# Patient Record
Sex: Female | Born: 1960
Health system: Southern US, Community
[De-identification: ages and names within clinical notes are randomized; demographics above are authoritative.]

## PROBLEM LIST (undated history)

## (undated) DIAGNOSIS — I10 Essential (primary) hypertension: Secondary | ICD-10-CM

---

## 1988-01-28 HISTORY — PX: THYROIDECTOMY: SHX17

## 2010-01-27 HISTORY — PX: ORIF TIBIA & FIBULA FRACTURES: SHX2131

## 2018-01-27 ENCOUNTER — Encounter (HOSPITAL_COMMUNITY): Payer: Self-pay | Admitting: Emergency Medicine

## 2018-01-27 ENCOUNTER — Emergency Department (HOSPITAL_COMMUNITY): Payer: Self-pay

## 2018-01-27 ENCOUNTER — Emergency Department (HOSPITAL_COMMUNITY)
Admission: EM | Admit: 2018-01-27 | Discharge: 2018-01-27 | Disposition: A | Discharge status: Home / Self Care | Payer: Self-pay | Attending: Emergency Medicine | Admitting: Emergency Medicine

## 2018-01-27 ENCOUNTER — Other Ambulatory Visit: Payer: Self-pay

## 2018-01-27 DIAGNOSIS — R69 Illness, unspecified: Secondary | ICD-10-CM

## 2018-01-27 DIAGNOSIS — I10 Essential (primary) hypertension: Secondary | ICD-10-CM | POA: Insufficient documentation

## 2018-01-27 DIAGNOSIS — J111 Influenza due to unidentified influenza virus with other respiratory manifestations: Secondary | ICD-10-CM | POA: Insufficient documentation

## 2018-01-27 HISTORY — DX: Essential (primary) hypertension: I10

## 2018-01-27 MED ORDER — BENZONATATE 100 MG PO CAPS: 100.0000 mg | ORAL_CAPSULE | Freq: Three times a day (TID) | ORAL | 0 refills | Status: DC

## 2018-01-27 MED ORDER — ALBUTEROL SULFATE HFA 108 (90 BASE) MCG/ACT IN AERS
2.0000 | INHALATION_SPRAY | Freq: Once | RESPIRATORY_TRACT | Status: AC
  Administered 2018-01-27: 2 via RESPIRATORY_TRACT
  Filled 2018-01-27: qty 6.7

## 2018-01-27 NOTE — ED Provider Notes (Signed)
[facility]-EMERGENCY DEPT Provider Note   CSN: [id number] Arrival date & time: 01/27/18  1035     History   Chief Complaint Chief Complaint  Patient presents with  . Flu Like Symptoms   Patient declines interpreter and states she speaks [place].  HPI Shannon Khan is a 58 y.o. female.  HPI  Patient is a 58 year old female with a history of hypertension who presents the emergency department with her daughter for evaluation of flulike symptoms that began about a week ago.  Patient has had cough, fever, body aches.  She also reports rhinorrhea, nasal congestion and a sore throat.  She has been taking Tylenol at home.  Symptoms have been constant since onset.  They have been improving.  Daughter at bedside states that patient recently moved here from [place].  States that she has had most of her immunizations but has not had her flu shot this year yet.  Patient denies any shortness of breath or chest pain.  No lower extremity swelling or abdominal pain.  No nausea vomiting diarrhea or urinary symptoms  Past Medical History:  Diagnosis Date  . Hypertension     There are no active problems to display for this patient.   History reviewed. No pertinent surgical history.   OB History   No obstetric history on file.      Home Medications    Prior to Admission medications   Medication Sig Start Date End Date Taking? Authorizing Provider  benzonatate (TESSALON) 100 MG capsule Take 1 capsule (100 mg total) by mouth every 8 (eight) hours. 01/27/18   Delesha Pohlman S, PA-C    Family History No family history on file.  Social History Social History   Tobacco Use  . Smoking status: Not on file  Substance Use Topics  . Alcohol use: Not on file  . Drug use: Not on file     Allergies   Patient has no known allergies.   Review of Systems Review of Systems  Constitutional: Positive for chills, diaphoresis and fever.  HENT: Positive for congestion,  rhinorrhea and sore throat. Negative for ear pain.   Eyes: Negative for pain and visual disturbance.  Respiratory: Positive for cough and wheezing. Negative for shortness of breath.   Cardiovascular: Negative for chest pain and leg swelling.  Gastrointestinal: Negative for abdominal pain, constipation, diarrhea, nausea and vomiting.  Genitourinary: Negative for dysuria.  Musculoskeletal: Positive for myalgias.  Skin: Negative for color change and rash.  Neurological: Negative for headaches.  All other systems reviewed and are negative.    Physical Exam Updated Vital Signs BP (!) 151/91   Pulse 78   Temp 98.2 F (36.8 C) (Oral)   Resp 16   Wt 98.7 kg   SpO2 98%   Physical Exam Vitals signs and nursing note reviewed.  Constitutional:      General: She is not in acute distress.    Appearance: She is well-developed. She is not ill-appearing or toxic-appearing.  HENT:     Head: Normocephalic and atraumatic.     Ears:     Comments: Right TM obstructed by cerumen.  Left TM appears opaque and likely with effusion present.  No erythema or bulging.    Nose:     Comments: Bilateral nasal turbinates are swollen.    Mouth/Throat:     Mouth: Mucous membranes are moist.     Pharynx: No oropharyngeal exudate or posterior oropharyngeal erythema.  Eyes:     Conjunctiva/sclera: Conjunctivae normal.  Neck:     Musculoskeletal: Neck supple.  Cardiovascular:     Rate and Rhythm: Normal rate and regular rhythm.     Heart sounds: Normal heart sounds. No murmur.  Pulmonary:     Effort: Pulmonary effort is normal. No respiratory distress.     Breath sounds: Normal breath sounds. No stridor. No wheezing or rhonchi.  Abdominal:     Palpations: Abdomen is soft.     Tenderness: There is no abdominal tenderness.  Lymphadenopathy:     Cervical: No cervical adenopathy.  Skin:    General: Skin is warm and dry.     Capillary Refill: Capillary refill takes less than 2 seconds.  Neurological:      Mental Status: She is alert.  Psychiatric:        Mood and Affect: Mood normal.      ED Treatments / Results  Labs (all labs ordered are listed, but only abnormal results are displayed) Labs Reviewed - No data to display  EKG None  Radiology Dg Chest 2 View  Result Date: 01/27/2018 CLINICAL DATA:  Cough fever body ache EXAM: CHEST - 2 VIEW COMPARISON:  None. FINDINGS: The heart size and mediastinal contours are within normal limits. Both lungs are clear. Degenerative changes of the spine. IMPRESSION: No active cardiopulmonary disease. Electronically Signed   By: Jasmine Pang M.D.   On: 01/27/2018 15:05    Procedures Procedures (including critical care time)  Medications Ordered in ED Medications  albuterol (PROVENTIL HFA;VENTOLIN HFA) 108 (90 Base) MCG/ACT inhaler 2 puff (2 puffs Inhalation Given 01/27/18 1503)     Initial Impression / Assessment and Plan / ED Course  I have reviewed the triage vital signs and the nursing notes.  Pertinent labs & imaging results that were available during my care of the patient were reviewed by me and considered in my medical decision making (see chart for details).     Final Clinical Impressions(s) / ED Diagnoses   Final diagnoses:  Influenza-like illness   Pt CXR negative for acute infiltrate. sxs most likely secondary to flu like illness. Discussed that antibiotics are not indicated for viral infections and that patient is out of the window for tamiflu. Pt will be discharged with symptomatic treatment.  Verbalizes understanding and is agreeable with plan. Pt is hemodynamically stable & in NAD prior to dc. Will given info for her to f/u with pcp. Advised to return if worse. She verbalizes understanding and is in agreement with plan. All questions answered.   ED Discharge Orders         Ordered    benzonatate (TESSALON) 100 MG capsule  Every 8 hours     01/27/18 1517           Merle Cirelli S, PA-C 01/27/18 1517    Linwood Dibbles, MD 01/30/18 1620

## 2018-01-27 NOTE — ED Triage Notes (Signed)
Pt complaint of cough, fever, aches for a few days; treating with tylenol at home.

## 2018-01-27 NOTE — Discharge Instructions (Addendum)

## 2018-03-30 ENCOUNTER — Ambulatory Visit: Payer: Self-pay | Admitting: Family Medicine

## 2018-06-14 ENCOUNTER — Other Ambulatory Visit: Payer: Self-pay

## 2018-06-14 ENCOUNTER — Ambulatory Visit: Payer: Self-pay | Attending: Internal Medicine | Admitting: Internal Medicine

## 2018-06-14 ENCOUNTER — Encounter: Payer: Self-pay | Admitting: Internal Medicine

## 2018-06-14 VITALS — BP 135/90 | HR 100 | Temp 98.7°F | Resp 16 | Ht 64.0 in | Wt 226.4 lb

## 2018-06-14 DIAGNOSIS — M25562 Pain in left knee: Secondary | ICD-10-CM

## 2018-06-14 DIAGNOSIS — Z1239 Encounter for other screening for malignant neoplasm of breast: Secondary | ICD-10-CM

## 2018-06-14 DIAGNOSIS — M25561 Pain in right knee: Secondary | ICD-10-CM

## 2018-06-14 DIAGNOSIS — Z9889 Other specified postprocedural states: Secondary | ICD-10-CM

## 2018-06-14 DIAGNOSIS — I10 Essential (primary) hypertension: Secondary | ICD-10-CM

## 2018-06-14 DIAGNOSIS — Z1211 Encounter for screening for malignant neoplasm of colon: Secondary | ICD-10-CM

## 2018-06-14 DIAGNOSIS — G8929 Other chronic pain: Secondary | ICD-10-CM

## 2018-06-14 DIAGNOSIS — E669 Obesity, unspecified: Secondary | ICD-10-CM

## 2018-06-14 DIAGNOSIS — Z9009 Acquired absence of other part of head and neck: Secondary | ICD-10-CM

## 2018-06-14 DIAGNOSIS — E890 Postprocedural hypothyroidism: Secondary | ICD-10-CM

## 2018-06-14 MED ORDER — MELOXICAM 15 MG PO TABS: 15.0000 mg | ORAL_TABLET | Freq: Every day | ORAL | 1 refills | Status: DC

## 2018-06-14 MED ORDER — HYDROCHLOROTHIAZIDE 12.5 MG PO TABS: 12.5000 mg | ORAL_TABLET | Freq: Every day | ORAL | 5 refills | Status: DC

## 2018-06-14 MED ORDER — AMLODIPINE BESYLATE 10 MG PO TABS: 10.0000 mg | ORAL_TABLET | Freq: Every day | ORAL | 5 refills | Status: DC

## 2018-06-14 NOTE — Progress Notes (Signed)
Pt is taking Bendroflumethiazide 2.5mg  and aspirin 75mg 

## 2018-06-14 NOTE — Patient Instructions (Signed)
Try cutting back on salt in the foods.   Continue Norvasc.  Start Hydrochlorothiazide.  Work on getting your weight down through healthy eating habits and regular exercise. Follow a Healthy Eating Plan - You can do it! Limit sugary drinks.  Avoid sodas, sweet tea, sport or energy drinks, or fruit drinks.  Drink water, lo-fat milk, or diet drinks. Limit snack foods.   Cut back on candy, cake, cookies, chips, ice cream.  These are a special treat, only in small amounts. Eat plenty of vegetables.  Especially dark green, red, and orange vegetables. Aim for at least 3 servings a day. More is better! Include fruit in your daily diet.  Whole fruit is much healthier than fruit juice! Limit "white" bread, "white" pasta, "white" rice.   Choose "100% whole grain" products, brown or wild rice. Avoid fatty meats. Try "Meatless Monday" and choose eggs or beans one day a week.  When eating meat, choose lean meats like chicken, [place], and fish.  Grill, broil, or bake meats instead of frying, and eat poultry without the skin. Eat less salt.  Avoid frozen pizzas, frozen dinners and salty foods.  Use seasonings other than salt in cooking.  This can help blood pressure and keep you from swelling Beer, wine and liquor have calories.  If you can safely drink alcohol, limit to 1 drink per day for women, 2 drinks for men  Start Meloxicam 15 mg daily for pain in the knees.  You can also take Tylenol 500 mg twice a day with this.
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
## 2018-06-15 LAB — COMPREHENSIVE METABOLIC PANEL
ALT: 30 IU/L (ref 0–32)
AST: 31 IU/L (ref 0–40)
Albumin/Globulin Ratio: 1.4 (ref 1.2–2.2)
Albumin: 4.6 g/dL (ref 3.8–4.9)
Alkaline Phosphatase: 106 IU/L (ref 39–117)
BUN/Creatinine Ratio: 14 (ref 9–23)
BUN: 10 mg/dL (ref 6–24)
Bilirubin Total: 0.2 mg/dL (ref 0.0–1.2)
CO2: 28 mmol/L (ref 20–29)
Calcium: 10.4 mg/dL — ABNORMAL HIGH (ref 8.7–10.2)
Chloride: 101 mmol/L (ref 96–106)
Creatinine, Ser: 0.72 mg/dL (ref 0.57–1.00)
GFR calc Af Amer: 106 mL/min/{1.73_m2} (ref >59)
GFR calc non Af Amer: 92 mL/min/{1.73_m2} (ref >59)
Globulin, Total: 3.4 g/dL (ref 1.5–4.5)
Glucose: 116 mg/dL — ABNORMAL HIGH (ref 65–99)
Potassium: 4.3 mmol/L (ref 3.5–5.2)
Sodium: 145 mmol/L — ABNORMAL HIGH (ref 134–144)
Total Protein: 8 g/dL (ref 6.0–8.5)

## 2018-06-15 LAB — CBC
Hematocrit: 46.5 % (ref 34.0–46.6)
Hemoglobin: 15.8 g/dL (ref 11.1–15.9)
MCH: 28.6 pg (ref 26.6–33.0)
MCHC: 34 g/dL (ref 31.5–35.7)
MCV: 84 fL (ref 79–97)
Platelets: 249 10*3/uL (ref 150–450)
RBC: 5.52 x10E6/uL — ABNORMAL HIGH (ref 3.77–5.28)
RDW: 12.9 % (ref 11.7–15.4)
WBC: 8.5 10*3/uL (ref 3.4–10.8)

## 2018-06-15 LAB — TSH+T4F+T3FREE
Free T4: 1.18 ng/dL (ref 0.82–1.77)
T3, Free: 3.1 pg/mL (ref 2.0–4.4)
TSH: 2.26 u[IU]/mL (ref 0.450–4.500)

## 2018-06-15 LAB — LIPID PANEL
Chol/HDL Ratio: 4 ratio (ref 0.0–4.4)
Cholesterol, Total: 258 mg/dL — ABNORMAL HIGH (ref 100–199)
HDL: 64 mg/dL (ref >39)
LDL Calculated: 176 mg/dL — ABNORMAL HIGH (ref 0–99)
Triglycerides: 92 mg/dL (ref 0–149)
VLDL Cholesterol Cal: 18 mg/dL (ref 5–40)

## 2018-06-23 ENCOUNTER — Telehealth: Payer: Self-pay

## 2018-06-23 LAB — FECAL OCCULT BLOOD, IMMUNOCHEMICAL: Fecal Occult Bld: NEGATIVE

## 2018-06-23 NOTE — Telephone Encounter (Signed)
Contacted pt to go over lab results pt didn't answer lvm asking pt to give me a call at her earliest convenience  

## 2018-06-23 NOTE — Telephone Encounter (Signed)
-----   Message from Marcine Matar, MD sent at 06/16/2018  1:22 PM EDT ----- Let pt and daughter know that her blood count is good meaning no anemia.  Blood sugar was mildly elevated.  Will screen for DM on f/u visit.  Sodium amd Calcium level mildly elevated.  The increase calcium can be caused by the thiazide diuretic blood pressure medicine she is on.  Try to drink several glasses of water daily.  LFT nl.  LDL cholesterol 176 with goal being less than 100.  Healthy eating habits and regular exercise as discussed yesterday can help to lower cholesterol.  Thyroid level is nl.

## 2018-08-10 ENCOUNTER — Other Ambulatory Visit: Payer: Self-pay | Admitting: Internal Medicine

## 2018-08-16 ENCOUNTER — Other Ambulatory Visit: Payer: Self-pay | Admitting: Internal Medicine

## 2018-09-16 ENCOUNTER — Encounter (HOSPITAL_COMMUNITY): Payer: Self-pay

## 2018-09-16 ENCOUNTER — Other Ambulatory Visit: Payer: Self-pay

## 2018-09-16 ENCOUNTER — Observation Stay (HOSPITAL_COMMUNITY)
Admission: EM | Admit: 2018-09-16 | Discharge: 2018-09-17 | Disposition: A | Discharge status: Home / Self Care | Payer: Self-pay | Attending: Internal Medicine | Admitting: Internal Medicine

## 2018-09-16 ENCOUNTER — Emergency Department (HOSPITAL_COMMUNITY): Payer: Self-pay

## 2018-09-16 DIAGNOSIS — Z79899 Other long term (current) drug therapy: Secondary | ICD-10-CM | POA: Insufficient documentation

## 2018-09-16 DIAGNOSIS — Z9009 Acquired absence of other part of head and neck: Secondary | ICD-10-CM

## 2018-09-16 DIAGNOSIS — R52 Pain, unspecified: Secondary | ICD-10-CM

## 2018-09-16 DIAGNOSIS — E1165 Type 2 diabetes mellitus with hyperglycemia: Secondary | ICD-10-CM | POA: Insufficient documentation

## 2018-09-16 DIAGNOSIS — Z7982 Long term (current) use of aspirin: Secondary | ICD-10-CM | POA: Insufficient documentation

## 2018-09-16 DIAGNOSIS — E876 Hypokalemia: Secondary | ICD-10-CM | POA: Insufficient documentation

## 2018-09-16 DIAGNOSIS — E890 Postprocedural hypothyroidism: Secondary | ICD-10-CM

## 2018-09-16 DIAGNOSIS — I4891 Unspecified atrial fibrillation: Principal | ICD-10-CM | POA: Insufficient documentation

## 2018-09-16 DIAGNOSIS — R0789 Other chest pain: Secondary | ICD-10-CM | POA: Insufficient documentation

## 2018-09-16 DIAGNOSIS — I10 Essential (primary) hypertension: Secondary | ICD-10-CM | POA: Insufficient documentation

## 2018-09-16 DIAGNOSIS — Z6841 Body Mass Index (BMI) 40.0 and over, adult: Secondary | ICD-10-CM | POA: Insufficient documentation

## 2018-09-16 DIAGNOSIS — Z20828 Contact with and (suspected) exposure to other viral communicable diseases: Secondary | ICD-10-CM | POA: Insufficient documentation

## 2018-09-16 DIAGNOSIS — E669 Obesity, unspecified: Secondary | ICD-10-CM | POA: Diagnosis present

## 2018-09-16 LAB — CBC
HCT: 43.2 % (ref 36.0–46.0)
Hemoglobin: 14.5 g/dL (ref 12.0–15.0)
MCH: 29.2 pg (ref 26.0–34.0)
MCHC: 33.6 g/dL (ref 30.0–36.0)
MCV: 86.9 fL (ref 80.0–100.0)
Platelets: 284 10*3/uL (ref 150–400)
RBC: 4.97 MIL/uL (ref 3.87–5.11)
RDW: 13.4 % (ref 11.5–15.5)
WBC: 9.8 10*3/uL (ref 4.0–10.5)
nRBC: 0 % (ref 0.0–0.2)

## 2018-09-16 LAB — BASIC METABOLIC PANEL
Anion gap: 11 (ref 5–15)
BUN: 13 mg/dL (ref 6–20)
CO2: 29 mmol/L (ref 22–32)
Calcium: 9.5 mg/dL (ref 8.9–10.3)
Chloride: 101 mmol/L (ref 98–111)
Creatinine, Ser: 1.05 mg/dL — ABNORMAL HIGH (ref 0.44–1.00)
GFR calc Af Amer: >60 mL/min (ref >60)
GFR calc non Af Amer: 58 mL/min — ABNORMAL LOW (ref >60)
Glucose, Bld: 162 mg/dL — ABNORMAL HIGH (ref 70–99)
Potassium: 3.5 mmol/L (ref 3.5–5.1)
Sodium: 141 mmol/L (ref 135–145)

## 2018-09-16 LAB — I-STAT BETA HCG BLOOD, ED (MC, WL, AP ONLY): I-stat hCG, quantitative: <5 m[IU]/mL (ref <5)

## 2018-09-16 LAB — TROPONIN I (HIGH SENSITIVITY): Troponin I (High Sensitivity): 7 ng/L (ref <18)

## 2018-09-16 MED ORDER — HEPARIN (PORCINE) 25000 UT/250ML-% IV SOLN
1000.0000 [IU]/h | INTRAVENOUS | Status: DC
  Administered 2018-09-16: 1000 [IU]/h via INTRAVENOUS
  Filled 2018-09-16: qty 250

## 2018-09-16 MED ORDER — DILTIAZEM HCL-DEXTROSE 100-5 MG/100ML-% IV SOLN (PREMIX)
5.0000 mg/h | INTRAVENOUS | Status: DC
  Administered 2018-09-16 – 2018-09-17 (×2): 10 mg/h via INTRAVENOUS
  Filled 2018-09-16 (×3): qty 100

## 2018-09-16 MED ORDER — DILTIAZEM LOAD VIA INFUSION
10.0000 mg | Freq: Once | INTRAVENOUS | Status: AC
  Administered 2018-09-16: 10 mg via INTRAVENOUS
  Filled 2018-09-16: qty 10

## 2018-09-16 MED ORDER — HEPARIN BOLUS VIA INFUSION
4000.0000 [IU] | Freq: Once | INTRAVENOUS | Status: AC
  Administered 2018-09-16: 4000 [IU] via INTRAVENOUS
  Filled 2018-09-16: qty 4000

## 2018-09-16 MED ORDER — SODIUM CHLORIDE 0.9% FLUSH: 3.0000 mL | Freq: Once | INTRAVENOUS | Status: DC

## 2018-09-16 NOTE — Progress Notes (Signed)
ANTICOAGULATION CONSULT NOTE - Initial Consult  Pharmacy Consult for heparin Indication: atrial fibrillation  No Known Allergies  Patient Measurements: Height: 5' (152.4 cm) Weight: 231 lb 7.7 oz (105 kg) IBW/kg (Calculated) : 45.5 Heparin Dosing Weight: 71.3  Vital Signs: BP: 123/104 (08/20 2300) Pulse Rate: 140 (08/20 2300)  Labs: Recent Labs    09/16/18 2247  HGB 14.5  HCT 43.2  PLT 284    CrCl cannot be calculated (Patient's most recent lab result is older than the maximum 21 days allowed.).   Medical History: Past Medical History:  Diagnosis Date  . Hypertension     Medications:  See medication history  Assessment: 58 yo lady with new onset afib to start heparin.  Baseline Hg 14.5, PTLC 284 Goal of Therapy:  Heparin level 0.3-0.7 units/ml Monitor platelets by anticoagulation protocol: Yes   Plan:  Heparin 4000 unit bolus and drip at 1000 units/hr Check heparin level 6-8 hours after start Daily HL and CBC while on heparin Monitor for bleeding complications  Thanks for allowing pharmacy to be a part of this patient's care.  Excell Seltzer, PharmD Clinical Pharmacist 09/16/2018,11:31 PM

## 2018-09-16 NOTE — ED Provider Notes (Signed)
[facility] Provider Note   CSN: [id number] Arrival date & time: 09/16/18  2232     History   Chief Complaint Chief Complaint  Patient presents with   Shortness of Breath   Dizziness    HPI Shannon Khan is a 58 y.o. female.     Patient with limited language barrier - speaks limited english with native [place] - history of HTN presents with SOB, chest tightness, LE paresthesia described as 'pins and needles', and dizziness. No syncope or near syncope. No headache. Her daughter is at bedside and reports she checks on her daily. She has found her to be tachycardic with irregular rhythm to as high as 150 for the past couple of days without other symptoms. She is unsure when the elevated heart rate started. Today, her daughter states she was not feeling well, complaining of the SOB and chest discomfort. No fever or cough.   The history is provided by the patient. A language interpreter was used (Daughter is at bedside, available as Nurse, learning disabilitytranslator. ).  Shortness of Breath Associated symptoms: chest pain   Associated symptoms: no abdominal pain, no cough, no fever and no vomiting   Dizziness Associated symptoms: chest pain and shortness of breath   Associated symptoms: no vomiting     Past Medical History:  Diagnosis Date   Hypertension     Patient Active Problem List   Diagnosis Date Noted   Essential hypertension 06/14/2018   Obesity (BMI 30-39.9) 06/14/2018   History of thyroidectomy 06/14/2018   Chronic pain of both knees 06/14/2018    Past Surgical History:  Procedure Laterality Date   ORIF TIBIA & FIBULA FRACTURES Left 2012   THYROIDECTOMY  1990   for goiter     OB History   No obstetric history on file.      Home Medications    Prior to Admission medications   Medication Sig Start Date End Date Taking? Authorizing Provider  amLODipine (NORVASC) 10 MG tablet Take 1 tablet (10 mg total) by mouth daily. 06/14/18    Marcine MatarJohnson, Deborah B, MD  aspirin EC 81 MG tablet Take 81 mg by mouth daily.    [provider]  hydrochlorothiazide (HYDRODIURIL) 12.5 MG tablet Take 1 tablet (12.5 mg total) by mouth daily. 06/14/18   Marcine MatarJohnson, Deborah B, MD  meloxicam (MOBIC) 15 MG tablet Take 1 tablet (15 mg total) by mouth daily. MUST MAKE APPT FOR FURTHER REFILLS 08/10/18   Marcine MatarJohnson, Deborah B, MD    Family History Family History  Problem Relation Age of Onset   Hypertension Mother    Hypertension Father    Stroke Father    Diabetes Paternal Aunt     Social History Social History   Tobacco Use   Smoking status: Never Smoker  Substance Use Topics   Alcohol use: Not on file    Comment: occasional wine   Drug use: Never     Allergies   Patient has no known allergies.   Review of Systems Review of Systems  Constitutional: Negative for chills and fever.  HENT: Negative.   Respiratory: Positive for shortness of breath. Negative for cough.   Cardiovascular: Positive for chest pain.  Gastrointestinal: Negative.  Negative for abdominal pain and vomiting.  Musculoskeletal: Negative.   Skin: Negative.   Neurological: Positive for dizziness. Negative for syncope.       See HPI.     Physical Exam Updated Vital Signs BP (!) 123/104    Pulse Marland Kitchen(!)  140    Resp (!) 26    Ht 5' (1.524 m)    Wt 105 kg    SpO2 99%    BMI 45.21 kg/m   Physical Exam Vitals signs and nursing note reviewed.  Constitutional:      Appearance: She is well-developed.  HENT:     Head: Normocephalic.  Neck:     Musculoskeletal: Normal range of motion and neck supple.  Cardiovascular:     Rate and Rhythm: Tachycardia present. Rhythm irregular.     Heart sounds: No murmur.  Pulmonary:     Effort: Pulmonary effort is normal.     Breath sounds: Normal breath sounds. No wheezing, rhonchi or rales.  Chest:     Chest wall: No tenderness.  Abdominal:     General: Bowel sounds are normal.     Palpations: Abdomen is soft.      Tenderness: There is no abdominal tenderness. There is no guarding or rebound.  Musculoskeletal: Normal range of motion.     Right lower leg: No edema.     Left lower leg: No edema.  Skin:    General: Skin is warm and dry.  Neurological:     Mental Status: She is alert and oriented to person, place, and time.      ED Treatments / Results  Labs (all labs ordered are listed, but only abnormal results are displayed) Labs Reviewed  CBC  BASIC METABOLIC PANEL  I-STAT BETA HCG BLOOD, ED (MC, [facility], AP ONLY)  TROPONIN I (HIGH SENSITIVITY)    EKG EKG Interpretation  Date/Time:  Thursday September 16 2018 22:45:28 EDT Ventricular Rate:  152 PR Interval:    QRS Duration: 68 QT Interval:  256 QTC Calculation: 407 R Axis:   2 Text Interpretation:  Atrial fibrillation with rapid ventricular response Cannot rule out Anterior infarct , age undetermined T wave abnormality, consider inferior ischemia No previous tracing Confirmed by Blanchie Dessert [phone]) on 09/16/2018 10:53:12 PM   Radiology No results found.  Procedures Procedures (including critical care time) CRITICAL CARE Performed by: Dewaine Oats   Total critical care time: 40 minutes  Critical care time was exclusive of separately billable procedures and treating other patients.  Critical care was necessary to treat or prevent imminent or life-threatening deterioration.  Critical care was time spent personally by me on the following activities: development of treatment plan with patient and/or surrogate as well as nursing, discussions with consultants, evaluation of patient's response to treatment, examination of patient, obtaining history from patient or surrogate, ordering and performing treatments and interventions, ordering and review of laboratory studies, ordering and review of radiographic studies, pulse oximetry and re-evaluation of patient's condition.  Medications Ordered in ED Medications  sodium chloride  flush (NS) 0.9 % injection 3 mL (has no administration in time range)  diltiazem (CARDIZEM) 1 mg/mL load via infusion 10 mg (has no administration in time range)    And  diltiazem (CARDIZEM) 100 mg in dextrose 5% 171mL (1 mg/mL) infusion (has no administration in time range)     Initial Impression / Assessment and Plan / ED Course  I have reviewed the triage vital signs and the nursing notes.  Pertinent labs & imaging results that were available during my care of the patient were reviewed by me and considered in my medical decision making (see chart for details).        Patient to ED with new onset atrial fibrillation of unknown onset. Daughter is a cardiac nurse  who found her to be in A-fib with RVR yesterday, possibly the day before, with symptoms today of chest discomfort, SOB, paresthesias.   She is in A-fib RVR to a rate of 152. Stable blood pressure, afebrile. Cardizem bolus of 10 mg ordered followed by drip to control rate. Heparin started per pharmacy protocol.   CXR is abnormal, ?edema vs atypical infection. COVID pending. Patient denies cough or fever at home. Has been socially isolating.   D[facility] admission [facility] who accepts the patient onto his service.   Final Clinical Impressions(s) / ED Diagnoses   Final diagnoses:  Pain   1. New onset atrial fibrillation with RVR 2. Abnormal CXR ED Discharge Orders    None       Elpidio AnisUpstill, Dravin Lance, Cordelia Poche-C 09/17/18 0055    Zadie RhineWickline, Donald, MD 09/17/18 [phone]

## 2018-09-16 NOTE — ED Provider Notes (Signed)
Patient seen/examined in the Emergency Department in conjunction with Advanced Practice Provider [place] Patient reports dizziness, chest pain and SOB This has been present for several days but unclear when it started Exam : awake/alert, no distress. Irregular/tachycardic rhythm noted Plan: plan to admit for new onset  atrial fibrillation  This patients CHA2DS2-VASc Score and unadjusted Ischemic Stroke Rate (% per year) is equal to 2.2 % stroke rate/year from a score of 2  Above score calculated as 1 point each if present [CHF, HTN, DM, Vascular=MI/PAD/Aortic Plaque, Age if 65-74, or Female] Above score calculated as 2 points each if present [Age > 75, or Stroke/TIA/TE]      Ripley Fraise, MD 09/16/18 2323

## 2018-09-16 NOTE — ED Triage Notes (Signed)
Pt arrives POV for eval of dizziness and tachycardia. Daughter reports persistent dizziness yesterday and when she checked her HR yesterday noted that she was irregular w/ a rate of 150-160. Daughter reports ongoing tachycardia today w/ associated chest pain, worsening dizziness and elevated HR. No hx of afib or irregular heartrate.

## 2018-09-17 ENCOUNTER — Other Ambulatory Visit: Payer: Self-pay

## 2018-09-17 ENCOUNTER — Observation Stay (HOSPITAL_BASED_OUTPATIENT_CLINIC_OR_DEPARTMENT_OTHER): Payer: Self-pay

## 2018-09-17 ENCOUNTER — Encounter (HOSPITAL_COMMUNITY): Payer: Self-pay | Admitting: Internal Medicine

## 2018-09-17 DIAGNOSIS — I10 Essential (primary) hypertension: Secondary | ICD-10-CM

## 2018-09-17 DIAGNOSIS — R079 Chest pain, unspecified: Secondary | ICD-10-CM

## 2018-09-17 DIAGNOSIS — I480 Paroxysmal atrial fibrillation: Secondary | ICD-10-CM

## 2018-09-17 DIAGNOSIS — I4891 Unspecified atrial fibrillation: Secondary | ICD-10-CM

## 2018-09-17 DIAGNOSIS — E669 Obesity, unspecified: Secondary | ICD-10-CM

## 2018-09-17 DIAGNOSIS — I340 Nonrheumatic mitral (valve) insufficiency: Secondary | ICD-10-CM

## 2018-09-17 LAB — MAGNESIUM: Magnesium: 2 mg/dL (ref 1.7–2.4)

## 2018-09-17 LAB — CBC WITH DIFFERENTIAL/PLATELET
Abs Immature Granulocytes: 0.02 10*3/uL (ref 0.00–0.07)
Basophils Absolute: 0.1 10*3/uL (ref 0.0–0.1)
Basophils Relative: 1 %
Eosinophils Absolute: 0.1 10*3/uL (ref 0.0–0.5)
Eosinophils Relative: 1 %
HCT: 40.5 % (ref 36.0–46.0)
Hemoglobin: 13.7 g/dL (ref 12.0–15.0)
Immature Granulocytes: 0 %
Lymphocytes Relative: 49 %
Lymphs Abs: 4.5 10*3/uL — ABNORMAL HIGH (ref 0.7–4.0)
MCH: 28.9 pg (ref 26.0–34.0)
MCHC: 33.8 g/dL (ref 30.0–36.0)
MCV: 85.4 fL (ref 80.0–100.0)
Monocytes Absolute: 0.5 10*3/uL (ref 0.1–1.0)
Monocytes Relative: 5 %
Neutro Abs: 4.2 10*3/uL (ref 1.7–7.7)
Neutrophils Relative %: 44 %
Platelets: 257 10*3/uL (ref 150–400)
RBC: 4.74 MIL/uL (ref 3.87–5.11)
RDW: 13.2 % (ref 11.5–15.5)
WBC: 9.8 10*3/uL (ref 4.0–10.5)
nRBC: 0 % (ref 0.0–0.2)

## 2018-09-17 LAB — COMPREHENSIVE METABOLIC PANEL
ALT: 26 U/L (ref 0–44)
AST: 26 U/L (ref 15–41)
Albumin: 3.4 g/dL — ABNORMAL LOW (ref 3.5–5.0)
Alkaline Phosphatase: 76 U/L (ref 38–126)
Anion gap: 12 (ref 5–15)
BUN: 13 mg/dL (ref 6–20)
CO2: 24 mmol/L (ref 22–32)
Calcium: 9.1 mg/dL (ref 8.9–10.3)
Chloride: 104 mmol/L (ref 98–111)
Creatinine, Ser: 0.96 mg/dL (ref 0.44–1.00)
GFR calc Af Amer: >60 mL/min (ref >60)
GFR calc non Af Amer: >60 mL/min (ref >60)
Glucose, Bld: 161 mg/dL — ABNORMAL HIGH (ref 70–99)
Potassium: 2.6 mmol/L — CL (ref 3.5–5.1)
Sodium: 140 mmol/L (ref 135–145)
Total Bilirubin: 0.9 mg/dL (ref 0.3–1.2)
Total Protein: 6.8 g/dL (ref 6.5–8.1)

## 2018-09-17 LAB — ECHOCARDIOGRAM COMPLETE
Height: 60 in
Weight: 3724.89 oz

## 2018-09-17 LAB — SARS CORONAVIRUS 2 BY RT PCR (HOSPITAL ORDER, PERFORMED IN ~~LOC~~ HOSPITAL LAB): SARS Coronavirus 2: NEGATIVE

## 2018-09-17 LAB — TROPONIN I (HIGH SENSITIVITY)
Troponin I (High Sensitivity): 7 ng/L (ref <18)
Troponin I (High Sensitivity): 8 ng/L (ref <18)

## 2018-09-17 LAB — BRAIN NATRIURETIC PEPTIDE: B Natriuretic Peptide: 252.2 pg/mL — ABNORMAL HIGH (ref 0.0–100.0)

## 2018-09-17 LAB — POTASSIUM: Potassium: 3.2 mmol/L — ABNORMAL LOW (ref 3.5–5.1)

## 2018-09-17 LAB — TSH: TSH: 2.915 u[IU]/mL (ref 0.350–4.500)

## 2018-09-17 LAB — HIV ANTIBODY (ROUTINE TESTING W REFLEX): HIV Screen 4th Generation wRfx: NONREACTIVE

## 2018-09-17 LAB — HEPARIN LEVEL (UNFRACTIONATED): Heparin Unfractionated: 0.39 IU/mL (ref 0.30–0.70)

## 2018-09-17 LAB — MRSA PCR SCREENING: MRSA by PCR: NEGATIVE

## 2018-09-17 MED ORDER — DILTIAZEM HCL ER COATED BEADS 240 MG PO CP24: 240.0000 mg | ORAL_CAPSULE | Freq: Every day | ORAL | 1 refills | Status: DC

## 2018-09-17 MED ORDER — ONDANSETRON HCL 4 MG/2ML IJ SOLN: 4.0000 mg | Freq: Four times a day (QID) | INTRAMUSCULAR | Status: DC | PRN

## 2018-09-17 MED ORDER — ASPIRIN EC 81 MG PO TBEC
81.0000 mg | DELAYED_RELEASE_TABLET | Freq: Every day | ORAL | Status: DC
  Administered 2018-09-17: 81 mg via ORAL
  Filled 2018-09-17: qty 1

## 2018-09-17 MED ORDER — DILTIAZEM HCL ER COATED BEADS 240 MG PO CP24
240.0000 mg | ORAL_CAPSULE | Freq: Every day | ORAL | Status: DC
  Administered 2018-09-17: 240 mg via ORAL
  Filled 2018-09-17: qty 1

## 2018-09-17 MED ORDER — HYDROCHLOROTHIAZIDE 25 MG PO TABS: 12.5000 mg | ORAL_TABLET | Freq: Every day | ORAL | Status: DC

## 2018-09-17 MED ORDER — ONDANSETRON HCL 4 MG PO TABS: 4.0000 mg | ORAL_TABLET | Freq: Four times a day (QID) | ORAL | Status: DC | PRN

## 2018-09-17 MED ORDER — POTASSIUM CHLORIDE ER 10 MEQ PO TBCR: 10.0000 meq | EXTENDED_RELEASE_TABLET | Freq: Every day | ORAL | 0 refills | Status: DC

## 2018-09-17 MED ORDER — ACETAMINOPHEN 650 MG RE SUPP: 650.0000 mg | Freq: Four times a day (QID) | RECTAL | Status: DC | PRN

## 2018-09-17 MED ORDER — ACETAMINOPHEN 325 MG PO TABS: 650.0000 mg | ORAL_TABLET | Freq: Four times a day (QID) | ORAL | Status: DC | PRN

## 2018-09-17 MED ORDER — APIXABAN 5 MG PO TABS: 5.0000 mg | ORAL_TABLET | Freq: Two times a day (BID) | ORAL | 1 refills | Status: DC

## 2018-09-17 MED ORDER — APIXABAN 5 MG PO TABS
5.0000 mg | ORAL_TABLET | Freq: Two times a day (BID) | ORAL | Status: DC
  Administered 2018-09-17 (×2): 5 mg via ORAL
  Filled 2018-09-17 (×2): qty 1

## 2018-09-17 MED ORDER — POTASSIUM CHLORIDE CRYS ER 20 MEQ PO TBCR
40.0000 meq | EXTENDED_RELEASE_TABLET | Freq: Two times a day (BID) | ORAL | Status: AC
  Administered 2018-09-17 (×2): 40 meq via ORAL
  Filled 2018-09-17 (×2): qty 2

## 2018-09-17 MED FILL — CARTIA XT 240 MG CAPSULE: 240 | 30 days supply | Qty: 30 | Fill #0

## 2018-09-17 MED FILL — POTASSIUM CHL ER M10 TABLET: 10 | 7 days supply | Qty: 7 | Fill #0

## 2018-09-17 MED FILL — ELIQUIS 5 MG TABLET: 5 | 30 days supply | Qty: 60 | Fill #0

## 2018-09-17 NOTE — ED Notes (Addendum)
Called 2 [phone] [phone]. No answer.

## 2018-09-17 NOTE — Progress Notes (Signed)
ANTICOAGULATION CONSULT NOTE - Initial Consult  Pharmacy Consult for heparin Indication: atrial fibrillation  No Known Allergies  Patient Measurements: Height: 5' (152.4 cm) Weight: 232 lb 12.9 oz (105.6 kg) IBW/kg (Calculated) : 45.5 Heparin Dosing Weight: 71.3  Vital Signs: Temp: 98.4 F (36.9 C) (08/21 0300) Temp Source: Oral (08/21 0300) BP: 103/75 (08/21 0730) Pulse Rate: 71 (08/21 0730)  Labs: Recent Labs    09/16/18 2247 09/17/18 0205 09/17/18 0337  HGB 14.5  --  13.7  HCT 43.2  --  40.5  PLT 284  --  257  HEPARINUNFRC  --   --  0.39  CREATININE 1.05*  --  0.96  TROPONINIHS 7 7 8     Estimated Creatinine Clearance: 68.4 mL/min (by C-G formula based on SCr of 0.96 mg/dL).   Medical History: Past Medical History:  Diagnosis Date  . Hypertension       Assessment: 58 yo female with new onset afib (CHADSVASC= 2) on heparin.   -heparin level at goal, CBC stable  Goal of Therapy:  Heparin level 0.3-0.7 units/ml Monitor platelets by anticoagulation protocol: Yes   Plan:  Continue heparin at 1000 units/hr Recheck heparin level today Daily HL and CBC while on heparin  Thanks for allowing pharmacy to be a part of this patient's care.  Hildred Laser, PharmD Clinical Pharmacist **Pharmacist phone directory can now be found on [email or web address] ([email or web address]).  Listed under [place].

## 2018-09-17 NOTE — H&P (Signed)
History and Physical    Shannon Khan PJK:[id number] DOB: 10/09/1960 DOA: 09/16/2018  PCP: Ladell Pier, MD  Patient coming from: Home.  Chief Complaint: Dizziness tachycardia.  HPI: Shannon Khan is a 58 y.o. female with history of hypertension has been experiencing tachycardia last 3 days.  Patient's daughter checks on her and found tachycardic and was planning to follow-up with a cardiologist.  Last evening patient started having some dizziness and shortness of breath and chest tightness.  At this point patient decided to come to the ER.  Denies any focal deficit headache visual symptoms nausea vomiting or diarrhea.  ED Course: In the ER patient is found to be in A. fib with RVR.  Chest x-ray shows some congestion troponin was negative initially.  Patient is afebrile COVID-19 test was negative.  Patient was started on Cardizem infusion and admitted for further management.  Creatinine is increased from baseline.  Review of Systems: As per HPI, rest all negative.   Past Medical History:  Diagnosis Date  . Hypertension     Past Surgical History:  Procedure Laterality Date  . ORIF TIBIA & FIBULA FRACTURES Left 2012  . THYROIDECTOMY  1990   for goiter     reports that she has never smoked. She has never used smokeless tobacco. She reports that she does not use drugs. No history on file for alcohol.  No Known Allergies  Family History  Problem Relation Age of Onset  . Hypertension Mother   . Hypertension Father   . Stroke Father   . Diabetes Paternal Aunt     Prior to Admission medications   Medication Sig Start Date End Date Taking? Authorizing Provider  amLODipine (NORVASC) 10 MG tablet Take 1 tablet (10 mg total) by mouth daily. 06/14/18  Yes Ladell Pier, MD  aspirin EC 81 MG tablet Take 81 mg by mouth daily.   Yes [provider]  hydrochlorothiazide (HYDRODIURIL) 12.5 MG tablet Take 1 tablet (12.5 mg total) by mouth daily. 06/14/18  Yes Ladell Pier, MD  meloxicam (MOBIC) 15 MG tablet Take 1 tablet (15 mg total) by mouth daily. MUST MAKE APPT FOR FURTHER REFILLS 08/10/18  Yes Ladell Pier, MD    Physical Exam: Constitutional: Moderately built and nourished. Vitals:   09/16/18 2345 09/16/18 2359 09/17/18 0000 09/17/18 0015  BP: 116/88  123/72 116/84  Pulse: (!) 33  (!) 134   Resp: (!) 29  (!) 29 17  Temp:  98.3 F (36.8 C)    TempSrc:  Oral    SpO2: 92%  99%   Weight:      Height:       Eyes: Anicteric no pallor. ENMT: No discharge from the ears eyes nose or mouth. Neck: No mass felt.  No neck rigidity. Respiratory: No rhonchi or crepitations. Cardiovascular: S1-S2 heard. Abdomen: Soft nontender bowel sounds present. Musculoskeletal: No edema. Skin: No rash. Neurologic: Alert awake oriented to time place and person.  Moves all extremities. Psychiatric: Appears normal per normal affect.   Labs on Admission: I have personally reviewed following labs and imaging studies  CBC: Recent Labs  Lab 09/16/18 2247  WBC 9.8  HGB 14.5  HCT 43.2  MCV 86.9  PLT 809   Basic Metabolic Panel: Recent Labs  Lab 09/16/18 2247  NA 141  K 3.5  CL 101  CO2 29  GLUCOSE 162*  BUN 13  CREATININE 1.05*  CALCIUM 9.5   GFR: Estimated Creatinine Clearance: 62.3 mL/min (A) (  by C-G formula based on SCr of 1.05 mg/dL (H)). Liver Function Tests: No results for input(s): AST, ALT, ALKPHOS, BILITOT, PROT, ALBUMIN in the last 168 hours. No results for input(s): LIPASE, AMYLASE in the last 168 hours. No results for input(s): AMMONIA in the last 168 hours. Coagulation Profile: No results for input(s): INR, PROTIME in the last 168 hours. Cardiac Enzymes: No results for input(s): CKTOTAL, CKMB, CKMBINDEX, TROPONINI in the last 168 hours. BNP (last 3 results) No results for input(s): PROBNP in the last 8760 hours. HbA1C: No results for input(s): HGBA1C in the last 72 hours. CBG: No results for input(s): GLUCAP in the  last 168 hours. Lipid Profile: No results for input(s): CHOL, HDL, LDLCALC, TRIG, CHOLHDL, LDLDIRECT in the last 72 hours. Thyroid Function Tests: No results for input(s): TSH, T4TOTAL, FREET4, T3FREE, THYROIDAB in the last 72 hours. Anemia Panel: No results for input(s): VITAMINB12, FOLATE, FERRITIN, TIBC, IRON, RETICCTPCT in the last 72 hours. Urine analysis: No results found for: COLORURINE, APPEARANCEUR, LABSPEC, PHURINE, GLUCOSEU, HGBUR, BILIRUBINUR, KETONESUR, PROTEINUR, UROBILINOGEN, NITRITE, LEUKOCYTESUR Sepsis Labs: @LABRCNTIP (procalcitonin:4,lacticidven:4) )No results found for this or any previous visit (from the past 240 hour(s)).   Radiological Exams on Admission: Dg Chest Port 1 View  Result Date: 09/16/2018 CLINICAL DATA:  Dizziness and tachycardia. Pain. EXAM: PORTABLE CHEST 1 VIEW COMPARISON:  01/27/2018 FINDINGS: Unchanged heart size and mediastinal contours. Mild central congestion. Slight heterogeneous lung base opacities. No pleural fluid or pneumothorax. No acute osseous abnormalities. IMPRESSION: Slight heterogeneous lung base opacities, atelectasis versus pneumonia, including atypical viral organisms. Electronically Signed   By: Narda RutherfordMelanie  Sanford M.D.   On: 09/16/2018 23:31    EKG: Independently reviewed.  A. fib with RVR.  Assessment/Plan Principal Problem:   Atrial fibrillation with RVR (HCC) Active Problems:   Essential hypertension    1. A. fib with RVR new onset -chads 2 vasc score is 2.  On heparin at this time.  Cardizem infusion for heart rate.  Check TSH cardiac markers 2D echo.  Cardiology consult. 2. Acute renal failure -will hold hydrochlorothiazide.  Monitor intake output metabolic panel check UA. 3. Hyperglycemia -check hemoglobin A1c with next blood draw. 4. Hypertension -presently on Cardizem infusion.  Holding hydrochlorothiazide due to acute renal failure.  Amlodipine on hold due to patient being on Cardizem.  Follow blood pressure trends.    DVT prophylaxis: Heparin. Code Status: Full code. Family Communication: Discussed with patient. Disposition Plan: Home. Consults called: Cardiology. Admission status: Observation.   Eduard ClosArshad N Graelyn Bihl MD [facility] Pager [phone].  If 7PM-7AM, please contact night-coverage [email or web address] Password [email or web address]  09/17/2018, 1:34 AM

## 2018-09-17 NOTE — ED Notes (Signed)
ED TO INPATIENT HANDOFF REPORT  ED Nurse Name and Phone #: Wyline CopasLouie,RN [phone]  S Name/Age/Gender Ladora Julien NordmannQuarmyne 58 y.o. female Room/Bed: 022C/022C  Code Status   Code Status: Not on file  Home/SNF/Other Home Patient oriented to: situation Is this baseline? No   Triage Complete: Triage complete  Chief Complaint sob, chest pain, dizzness  Triage Note Pt arrives POV for eval of dizziness and tachycardia. Daughter reports persistent dizziness yesterday and when she checked her HR yesterday noted that she was irregular w/ a rate of 150-160. Daughter reports ongoing tachycardia today w/ associated chest pain, worsening dizziness and elevated HR. No hx of afib or irregular heartrate.    Allergies No Known Allergies  Level of Care/Admitting Diagnosis ED Disposition    None      B Medical/Surgery History Past Medical History:  Diagnosis Date  . Hypertension    Past Surgical History:  Procedure Laterality Date  . ORIF TIBIA & FIBULA FRACTURES Left 2012  . THYROIDECTOMY  1990   for goiter     A IV Location/Drains/Wounds Patient Lines/Drains/Airways Status   Active Line/Drains/Airways    Name:   Placement date:   Placement time:   Site:   Days:   Peripheral IV 09/16/18 Right Antecubital   09/16/18    2300    Antecubital   1   Peripheral IV 09/16/18 Left Hand   09/16/18    2340    Hand   1          Intake/Output Last 24 hours  Intake/Output Summary (Last 24 hours) at 09/17/2018 0000 Last data filed at 09/16/2018 2302 Gross per 24 hour  Intake 0 ml  Output 0 ml  Net 0 ml    Labs/Imaging Results for orders placed or performed during the hospital encounter of 09/16/18 (from the past 48 hour(s))  Basic metabolic panel     Status: Abnormal   Collection Time: 09/16/18 10:47 PM  Result Value Ref Range   Sodium 141 135 - 145 mmol/L   Potassium 3.5 3.5 - 5.1 mmol/L   Chloride 101 98 - 111 mmol/L   CO2 29 22 - 32 mmol/L   Glucose, Bld 162 (H) 70 - 99 mg/dL   BUN  13 6 - 20 mg/dL   Creatinine, Ser 1.471.05 (H) 0.44 - 1.00 mg/dL   Calcium 9.5 8.9 - 82.910.3 mg/dL   GFR calc non Af Amer 58 (L) >60 mL/min   GFR calc Af Amer >60 >60 mL/min   Anion gap 11 5 - 15    Comment: Performed at [facility], [address] [place], [place] [address]  CBC     Status: None   Collection Time: 09/16/18 10:47 PM  Result Value Ref Range   WBC 9.8 4.0 - 10.5 K/uL   RBC 4.97 3.87 - 5.11 MIL/uL   Hemoglobin 14.5 12.0 - 15.0 g/dL   HCT 08.643.2 57.836.0 - 46.946.0 %   MCV 86.9 80.0 - 100.0 fL   MCH 29.2 26.0 - 34.0 pg   MCHC 33.6 30.0 - 36.0 g/dL   RDW 62.913.4 52.811.5 - 41.315.5 %   Platelets 284 150 - 400 K/uL   nRBC 0.0 0.0 - 0.2 %    Comment: Performed at [facility], [address] [place], [place] [address]  Troponin I (High Sensitivity)     Status: None   Collection Time: 09/16/18 10:47 PM  Result Value Ref Range   Troponin I (High Sensitivity) 7 <18  ng/L    Comment: (NOTE) Elevated high sensitivity troponin I (hsTnI) values and significant  changes across serial measurements may suggest ACS but many other  chronic and acute conditions are known to elevate hsTnI results.  Refer to the "Links" section for chest pain algorithms and additional  guidance. Performed at [facility], [place] [address] [place], [place] [address]   I-Stat beta hCG blood, ED     Status: None   Collection Time: 09/16/18 10:57 PM  Result Value Ref Range   I-stat hCG, quantitative <5.0 <5 mIU/mL   Comment 3            Comment:   GEST. AGE      CONC.  (mIU/mL)   <=1 WEEK        5 - 50     2 WEEKS       50 - 500     3 WEEKS       100 - 10,000     4 WEEKS     1,000 - 30,000        FEMALE AND NON-PREGNANT FEMALE:     LESS THAN 5 mIU/mL    Dg Chest Port 1 View  Result Date: 09/16/2018 CLINICAL DATA:  Dizziness and tachycardia. Pain. EXAM: PORTABLE CHEST 1 VIEW COMPARISON:  01/27/2018 FINDINGS: Unchanged heart size and mediastinal contours. Mild central congestion. Slight heterogeneous  lung base opacities. No pleural fluid or pneumothorax. No acute osseous abnormalities. IMPRESSION: Slight heterogeneous lung base opacities, atelectasis versus pneumonia, including atypical viral organisms. Electronically Signed   By: Keith Rake M.D.   On: 09/16/2018 23:31    Pending Labs Unresulted Labs (From admission, onward)    Start     Ordered   09/18/18 0500  Heparin level (unfractionated)  Daily,   R     09/16/18 2335   09/18/18 0500  CBC  Daily,   R     09/16/18 2335   09/17/18 0700  Heparin level (unfractionated)  Once-Timed,   STAT     09/16/18 2335   09/16/18 2339  SARS Coronavirus 2 [facility] order, Performed in [facility] hospital lab) Nasopharyngeal Nasopharyngeal Swab  (Symptomatic/High Risk of Exposure/Tier 1 Patients Labs with Precautions)  Once,   STAT    Question Answer Comment  Is this test for diagnosis or screening Diagnosis of ill patient   Symptomatic for COVID-19 as defined by CDC Yes   Date of Symptom Onset 09/15/2018   Hospitalized for COVID-19 Yes   Admitted to ICU for COVID-19 No   Previously tested for COVID-19 No   Resident in a congregate (group) care setting No   Employed in healthcare setting No   Pregnant No      09/16/18 2339          Vitals/Pain Today's Vitals   09/16/18 2315 09/16/18 2330 09/16/18 2345 09/16/18 2359  BP: (!) 132/119 113/72 116/88   Pulse: (!) 144 (!) 143 (!) 33   Resp: (!) 21 (!) 22 (!) 29   Temp:    98.3 F (36.8 C)  TempSrc:    Oral  SpO2: 99% 98% 92%   Weight:      Height:      PainSc:        Isolation Precautions Airborne and Contact precautions  Medications Medications  sodium chloride flush (NS) 0.9 % injection 3 mL (has no administration in time range)  diltiazem (CARDIZEM) 1 mg/mL load via infusion 10 mg (10 mg Intravenous Bolus  from Bag 09/16/18 2331)    And  diltiazem (CARDIZEM) 100 mg in dextrose 5% 100mL (1 mg/mL) infusion (10 mg/hr Intravenous New Bag/Given 09/16/18 2357)  heparin ADULT  infusion 100 units/mL (25000 units/29250mL sodium chloride 0.45%) (1,000 Units/hr Intravenous New Bag/Given 09/16/18 2355)  heparin bolus via infusion 4,000 Units (4,000 Units Intravenous Bolus from Bag 09/16/18 2347)    Mobility walks Low fall risk   Focused Assessments Cardiac Assessment Handoff:  Cardiac Rhythm: Atrial fibrillation No results found for: CKTOTAL, CKMB, CKMBINDEX, TROPONINI No results found for: DDIMER Does the Patient currently have chest pain? No      R Recommendations: See Admitting Provider Note  Report given to:   Additional Notes:  New onset Afib RVR. Bilateral IV access; Diltiazem & Heparin drip.

## 2018-09-17 NOTE — Progress Notes (Addendum)
Daughter here to take patient home. Patient changing into clothes. PM Eliquis given. Vitals reviewed with daughter. Daughter is looking through paperwork. No questions. No IVs noted. Tele discontinued by previous shift nurse.

## 2018-09-17 NOTE — Progress Notes (Signed)
Discharge instructions given to pt. All questions answered to pt satisfaction. Pt awaiting for DA to pick her up.

## 2018-09-17 NOTE — Progress Notes (Signed)
  Echocardiogram 2D Echocardiogram has been performed.  Shannon Khan 09/17/2018, 10:39 AM

## 2018-09-17 NOTE — TOC Transition Note (Addendum)
Transition of Care [facility]) - CM/SW Discharge Note   Patient Details  Name: Shannon Khan MRN: [id number] Date of Birth: 13-Nov-1960  Transition of Care [facility]) CM/SW Contact:  Zenon Mayo, RN Phone Number: 09/17/2018, 5:27 PM   Clinical Narrative:    Patient for dc today, will be on eliquis, he has the 30 day filled by [facility], NCM will give him the patient ast form to take to [facility] to follow up apt.  He should be able to get refill for 10.00 while awaiting patient ast to go thru.  Patient 's daughter has paid for her other meds already.   Final next level of care: Home/Self Care Barriers to Discharge: No Barriers Identified   Patient Goals and CMS Choice Patient states their goals for this hospitalization and ongoing recovery are:: get better   Choice offered to / list presented to : NA  Discharge Placement                       Discharge Plan and Services                DME Arranged: (NA)         HH Arranged: NA          Social Determinants of Health (SDOH) Interventions     Readmission Risk Interventions No flowsheet data found.
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
## 2018-09-17 NOTE — Discharge Summary (Signed)
Discharge Summary  Cresenciano LickGifty Batte ZOX:[id number] DOB: 03-01-1960  PCP: Marcine MatarJohnson, Deborah B, MD  Admit date: 09/16/2018 Discharge date: 09/17/2018  Time spent: 30 mins  Recommendations for Outpatient Follow-up:  1. PCP with follow-up labs 2. Cardiology  Discharge Diagnoses:  Active Hospital Problems   Diagnosis Date Noted  . Atrial fibrillation with RVR (HCC) 09/17/2018  . Essential hypertension 06/14/2018  . Obesity (BMI 30-39.9) 06/14/2018  . History of thyroidectomy 06/14/2018    Resolved Hospital Problems  No resolved problems to display.    Discharge Condition: Stable  Diet recommendation: Heart healthy  Vitals:   09/17/18 1507 09/17/18 1650  BP: 96/70 104/76  Pulse: 88 95  Resp: (!) 24 16  Temp: 98.3 F (36.8 C)   SpO2: 98% 97%    History of present illness:  Natahsa Julien NordmannQuarmyne is a 58 y.o. female with history of hypertension has been experiencing tachycardia last 3 days.  Patient's daughter checks on her and found tachycardic and was planning to follow-up with a cardiologist.  Last evening patient started having some dizziness and shortness of breath and chest tightness.  At this point patient decided to come to the ER.  Denies any focal deficit headache visual symptoms nausea vomiting or diarrhea. In the ER patient is found to be in A. fib with RVR.  Chest x-ray shows some congestion troponin was negative initially.  Patient is afebrile COVID-19 test was negative.  Patient was started on Cardizem infusion and admitted for further management.   Today, patient denies any new complaints.  Denies any further chest tightness/pain, shortness of breath, abdominal pain, nausea/vomiting, fever/chills.  Hospital Course:  Principal Problem:   Atrial fibrillation with RVR (HCC) Active Problems:   Essential hypertension   Obesity (BMI 30-39.9)   History of thyroidectomy  New onset atrial fibrillation with RVR Currently rate controlled, converted back to sinus rhythm EKG on  presentation with A. Fib TSH within normal limits Echo showed normal EF 60 to 65%, no regional wall motion abnormalities Status post Cardizem drip, switch to p.o. Cardizem Start Eliquis for anticoagulation Follow-up with PCP and cardiology as an outpatient  Atypical chest pain Associated with some shortness of breath Troponin x2-, EKG with no acute ST changes BNP 252 Chest x-ray showed slight heterogeneous lung base opacities, atelectasis versus pneumonia including atypical viral organisms Patient denies any cough, no fever, no signs of infection Echo as above Follow-up with PCP  Hypokalemia Replaced PRN Discharge on potassium supplements Follow-up with PCP  Hyperglycemia Follow-up with PCP  Hypertension BP stable Discontinue hydrochlorothiazide, amlodipine Start p.o. Cardizem Follow-up with PCP  Morbid obesity Lifestyle modification advised          Malnutrition Type:      Malnutrition Characteristics:      Nutrition Interventions:      Estimated body mass index is 45.47 kg/m as calculated from the following:   Height as of this encounter: 5' (1.524 m).   Weight as of this encounter: 105.6 kg.    Procedures:  None  Consultations:  Cardiology  Discharge Exam: BP 104/76 (BP Location: Left Arm)   Pulse 95   Temp 98.3 F (36.8 C) (Oral)   Resp 16   Ht 5' (1.524 m)   Wt 105.6 kg   SpO2 97%   BMI 45.47 kg/m   General: NAD Cardiovascular: S1, S2 present Respiratory: CTA B  Discharge Instructions You were cared for by a hospitalist during your hospital stay. If you have any questions about your discharge medications or  the care you received while you were in the hospital after you are discharged, you can call the unit and asked to speak with the hospitalist on call if the hospitalist that took care of you is not available. Once you are discharged, your primary care physician will handle any further medical issues. Please note that NO  REFILLS for any discharge medications will be authorized once you are discharged, as it is imperative that you return to your primary care physician (or establish a relationship with a primary care physician if you do not have one) for your aftercare needs so that they can reassess your need for medications and monitor your lab values.   Allergies as of 09/17/2018   No Known Allergies     Medication List    STOP taking these medications   amLODipine 10 MG tablet Commonly known as: NORVASC   aspirin EC 81 MG tablet   hydrochlorothiazide 12.5 MG tablet Commonly known as: HYDRODIURIL   meloxicam 15 MG tablet Commonly known as: MOBIC     TAKE these medications   apixaban 5 MG Tabs tablet Commonly known as: ELIQUIS Take 1 tablet (5 mg total) by mouth 2 (two) times daily.   diltiazem 240 MG 24 hr capsule Commonly known as: CARDIZEM CD Take 1 capsule (240 mg total) by mouth daily. Start taking on: September 18, 2018   potassium chloride 10 MEQ tablet Commonly known as: K-DUR Take 1 tablet (10 mEq total) by mouth daily for 7 days.      No Known Allergies Follow-up Information    [facility] Follow up on 10/11/2018.   Why: 11 am for hospital follow up Contact information: [place] [phone]       Lelon Perla, MD Follow up.   Specialty: Cardiology Why: Will call for a follow up appointment Contact information: [place] [address] [phone]            The results of significant diagnostics from this hospitalization (including imaging, microbiology, ancillary and laboratory) are listed below for reference.    Significant Diagnostic Studies: Dg Chest Port 1 View  Result Date: 09/16/2018 CLINICAL DATA:  Dizziness and tachycardia. Pain. EXAM: PORTABLE CHEST 1 VIEW COMPARISON:  01/27/2018 FINDINGS: Unchanged heart size and mediastinal contours. Mild  central congestion. Slight heterogeneous lung base opacities. No pleural fluid or pneumothorax. No acute osseous abnormalities. IMPRESSION: Slight heterogeneous lung base opacities, atelectasis versus pneumonia, including atypical viral organisms. Electronically Signed   By: Keith Rake M.D.   On: 09/16/2018 23:31    Microbiology: Recent Results (from the past 240 hour(s))  SARS Coronavirus 2 [facility] order, Performed in [facility] hospital lab) Nasopharyngeal Nasopharyngeal Swab     Status: None   Collection Time: 09/16/18 11:50 PM   Specimen: Nasopharyngeal Swab  Result Value Ref Range Status   SARS Coronavirus 2 NEGATIVE NEGATIVE Final    Comment: (NOTE) If result is NEGATIVE SARS-CoV-2 target nucleic acids are NOT DETECTED. The SARS-CoV-2 RNA is generally detectable in upper and lower  respiratory specimens during the acute phase of infection. The lowest  concentration of SARS-CoV-2 viral copies this assay can detect is 250  copies / mL. A negative result does not preclude SARS-CoV-2 infection  and should not be used as the sole basis for treatment or other  patient management decisions.  A negative result may occur with  improper specimen collection / handling, submission of specimen  other  than nasopharyngeal swab, presence of viral mutation(s) within the  areas targeted by this assay, and inadequate number of viral copies  (<250 copies / mL). A negative result must be combined with clinical  observations, patient history, and epidemiological information. If result is POSITIVE SARS-CoV-2 target nucleic acids are DETECTED. The SARS-CoV-2 RNA is generally detectable in upper and lower  respiratory specimens dur ing the acute phase of infection.  Positive  results are indicative of active infection with SARS-CoV-2.  Clinical  correlation with patient history and other diagnostic information is  necessary to determine patient infection status.  Positive results do  not rule  out bacterial infection or co-infection with other viruses. If result is PRESUMPTIVE POSTIVE SARS-CoV-2 nucleic acids MAY BE PRESENT.   A presumptive positive result was obtained on the submitted specimen  and confirmed on repeat testing.  While 2019 novel coronavirus  (SARS-CoV-2) nucleic acids may be present in the submitted sample  additional confirmatory testing may be necessary for epidemiological  and / or clinical management purposes  to differentiate between  SARS-CoV-2 and other Sarbecovirus currently known to infect humans.  If clinically indicated additional testing with an alternate test  methodology [phone]) is advised. The SARS-CoV-2 RNA is generally  detectable in upper and lower respiratory sp ecimens during the acute  phase of infection. The expected result is Negative. Fact Sheet for Patients:  BoilerBrush.com.cyhttps://www.fda.gov/media/136312/download Fact Sheet for Healthcare Providers: https://pope.com/https://www.fda.gov/media/136313/download This test is not yet approved or cleared by the [place] FDA and has been authorized for detection and/or diagnosis of SARS-CoV-2 by FDA under an Emergency Use Authorization (EUA).  This EUA will remain in effect (meaning this test can be used) for the duration of the COVID-19 declaration under Section 564(b)(1) of the Act, 21 U.S.C. section 360bbb-3(b)(1), unless the authorization is terminated or revoked sooner. Performed at [facility], [address] [place], [place] [address]   MRSA PCR Screening     Status: None   Collection Time: 09/17/18  3:49 AM   Specimen: Nasal Mucosa; Nasopharyngeal  Result Value Ref Range Status   MRSA by PCR NEGATIVE NEGATIVE Final    Comment:        The GeneXpert MRSA Assay (FDA approved for NASAL specimens only), is one component of a comprehensive MRSA colonization surveillance program. It is not intended to diagnose MRSA infection nor to guide or monitor treatment for MRSA infections. Performed at  [facility], [address] [place], [place] [address]      Labs: Basic Metabolic Panel: Recent Labs  Lab 09/16/18 2247 09/17/18 0337 09/17/18 1402  NA 141 140  --   K 3.5 2.6* 3.2*  CL 101 104  --   CO2 29 24  --   GLUCOSE 162* 161*  --   BUN 13 13  --   CREATININE 1.05* 0.96  --   CALCIUM 9.5 9.1  --   MG  --  2.0  --    Liver Function Tests: Recent Labs  Lab 09/17/18 0337  AST 26  ALT 26  ALKPHOS 76  BILITOT 0.9  PROT 6.8  ALBUMIN 3.4*   No results for input(s): LIPASE, AMYLASE in the last 168 hours. No results for input(s): AMMONIA in the last 168 hours. CBC: Recent Labs  Lab 09/16/18 2247 09/17/18 0337  WBC 9.8 9.8  NEUTROABS  --  4.2  HGB 14.5 13.7  HCT 43.2 40.5  MCV 86.9 85.4  PLT 284 257  Cardiac Enzymes: No results for input(s): CKTOTAL, CKMB, CKMBINDEX, TROPONINI in the last 168 hours. BNP: BNP (last 3 results) Recent Labs    09/17/18 0205  BNP 252.2*    ProBNP (last 3 results) No results for input(s): PROBNP in the last 8760 hours.  CBG: No results for input(s): GLUCAP in the last 168 hours.     Signed:  Briant CedarNkeiruka J , MD [facility] 09/17/2018, 4:55 PM

## 2018-09-20 ENCOUNTER — Ambulatory Visit: Payer: Self-pay | Admitting: Internal Medicine
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
## 2018-10-11 ENCOUNTER — Ambulatory Visit: Payer: Self-pay | Attending: Critical Care Medicine | Admitting: Critical Care Medicine

## 2018-10-11 ENCOUNTER — Other Ambulatory Visit: Payer: Self-pay

## 2018-10-11 ENCOUNTER — Encounter: Payer: Self-pay | Admitting: Critical Care Medicine

## 2018-10-11 VITALS — BP 147/89 | HR 90 | Ht 60.0 in | Wt 227.2 lb

## 2018-10-11 DIAGNOSIS — Z23 Encounter for immunization: Secondary | ICD-10-CM

## 2018-10-11 DIAGNOSIS — I4891 Unspecified atrial fibrillation: Secondary | ICD-10-CM

## 2018-10-11 DIAGNOSIS — R739 Hyperglycemia, unspecified: Secondary | ICD-10-CM | POA: Insufficient documentation

## 2018-10-11 DIAGNOSIS — E669 Obesity, unspecified: Secondary | ICD-10-CM

## 2018-10-11 DIAGNOSIS — R2689 Other abnormalities of gait and mobility: Secondary | ICD-10-CM

## 2018-10-11 DIAGNOSIS — Z7901 Long term (current) use of anticoagulants: Secondary | ICD-10-CM

## 2018-10-11 DIAGNOSIS — G8929 Other chronic pain: Secondary | ICD-10-CM

## 2018-10-11 DIAGNOSIS — I10 Essential (primary) hypertension: Secondary | ICD-10-CM

## 2018-10-11 DIAGNOSIS — Z1159 Encounter for screening for other viral diseases: Secondary | ICD-10-CM

## 2018-10-11 MED ORDER — MELOXICAM 7.5 MG PO TABS: 7.5000 mg | ORAL_TABLET | Freq: Every day | ORAL | 0 refills | Status: DC

## 2018-10-11 MED ORDER — LOSARTAN POTASSIUM 50 MG PO TABS: 50.0000 mg | ORAL_TABLET | Freq: Every day | ORAL | 11 refills | Status: DC

## 2018-10-11 MED ORDER — APIXABAN 5 MG PO TABS: 5.0000 mg | ORAL_TABLET | Freq: Two times a day (BID) | ORAL | 1 refills | Status: DC

## 2018-10-11 MED ORDER — DILTIAZEM HCL ER COATED BEADS 240 MG PO CP24: 240.0000 mg | ORAL_CAPSULE | Freq: Every day | ORAL | 1 refills | Status: DC

## 2018-10-11 MED FILL — MELOXICAM 7.5 MG TABLET: 7.5 | 30 days supply | Qty: 30 | Fill #0

## 2018-10-11 MED FILL — ELIQUIS 5 MG TABLET: 5 | 30 days supply | Qty: 60 | Fill #0

## 2018-10-11 MED FILL — LOSARTAN POTASSIUM 50 MG TA: 50 | 30 days supply | Qty: 30 | Fill #0

## 2018-10-11 MED FILL — DILTIAZEM 24HR CD 240 MG CA: 240 | 30 days supply | Qty: 30 | Fill #0

## 2018-10-11 NOTE — Assessment & Plan Note (Signed)
Blood pressures ranging up and is not yet well controlled on Cardizem alone therefore we will add losartan 50 mg daily  We will hold off on diuretic due to hyperkalemia and arrhythmia

## 2018-10-11 NOTE — Assessment & Plan Note (Signed)
Chronic pain in both knees with imbalance issues but no falls  Given the fact the patient is on chronic anticoagulation will refer to physical therapy and will resume meloxicam at a lower dose 7.5 mg daily

## 2018-10-11 NOTE — Assessment & Plan Note (Signed)
We will follow-up CBC given the patient is on chronic anticoagulation

## 2018-10-11 NOTE — Assessment & Plan Note (Signed)
Physical therapy referral as planned

## 2018-10-11 NOTE — Patient Instructions (Signed)
A flu shot was given  Begin losartan 50 mg daily and continue diltiazem 240 mg daily along with the Eliquis twice daily  Patient assistance to achieve Eliquis will be given  Referral to physical therapy, nutritional therapy, and cardiology will be made with appointments in October after you obtain insurance  Labs today include hemoglobin A1c, complete metabolic panel, complete blood count, hepatitis C  Return to see Dr. Wynetta Emery in 1 month for follow-up

## 2018-10-11 NOTE — Assessment & Plan Note (Signed)
Atrial fibrillation with history of rapid ventricular response now in sinus rhythm on exam on the Cardizem to 40 mg daily and apixaban 5 mg twice daily  We will plan to refill both medications and obtain patient access for the apixaban  Note echocardiogram in the hospital was normal and troponins were negative thyroid function also was normal  Plan will also be to refer to cardiology

## 2018-10-11 NOTE — Assessment & Plan Note (Signed)
We will follow-up complete metabolic profile and hemoglobin A1c

## 2018-10-11 NOTE — Assessment & Plan Note (Signed)
With moderate obesity will refer to nutritional therapy for weight loss

## 2018-10-12 ENCOUNTER — Telehealth: Payer: Self-pay | Admitting: Critical Care Medicine

## 2018-10-12 DIAGNOSIS — E119 Type 2 diabetes mellitus without complications: Secondary | ICD-10-CM | POA: Insufficient documentation

## 2018-10-12 LAB — CBC WITH DIFFERENTIAL/PLATELET
Basophils Absolute: 0.1 10*3/uL (ref 0.0–0.2)
Basos: 2 %
EOS (ABSOLUTE): 0.1 10*3/uL (ref 0.0–0.4)
Eos: 1 %
Hematocrit: 45.7 % (ref 34.0–46.6)
Hemoglobin: 15.3 g/dL (ref 11.1–15.9)
Immature Grans (Abs): 0 10*3/uL (ref 0.0–0.1)
Immature Granulocytes: 0 %
Lymphocytes Absolute: 3.5 10*3/uL — ABNORMAL HIGH (ref 0.7–3.1)
Lymphs: 50 %
MCH: 28.1 pg (ref 26.6–33.0)
MCHC: 33.5 g/dL (ref 31.5–35.7)
MCV: 84 fL (ref 79–97)
Monocytes Absolute: 0.5 10*3/uL (ref 0.1–0.9)
Monocytes: 7 %
Neutrophils Absolute: 2.8 10*3/uL (ref 1.4–7.0)
Neutrophils: 40 %
Platelets: 193 10*3/uL (ref 150–450)
RBC: 5.44 x10E6/uL — ABNORMAL HIGH (ref 3.77–5.28)
RDW: 13.1 % (ref 11.7–15.4)
WBC: 7 10*3/uL (ref 3.4–10.8)

## 2018-10-12 LAB — COMPREHENSIVE METABOLIC PANEL
ALT: 20 IU/L (ref 0–32)
AST: 17 IU/L (ref 0–40)
Albumin/Globulin Ratio: 1.6 (ref 1.2–2.2)
Albumin: 4.5 g/dL (ref 3.8–4.9)
Alkaline Phosphatase: 103 IU/L (ref 39–117)
BUN/Creatinine Ratio: 16 (ref 12–28)
BUN: 12 mg/dL (ref 8–27)
Bilirubin Total: 0.3 mg/dL (ref 0.0–1.2)
CO2: 26 mmol/L (ref 20–29)
Calcium: 9.9 mg/dL (ref 8.7–10.3)
Chloride: 104 mmol/L (ref 96–106)
Creatinine, Ser: 0.75 mg/dL (ref 0.57–1.00)
GFR calc Af Amer: 100 mL/min/{1.73_m2} (ref >59)
GFR calc non Af Amer: 87 mL/min/{1.73_m2} (ref >59)
Globulin, Total: 2.9 g/dL (ref 1.5–4.5)
Glucose: 124 mg/dL — ABNORMAL HIGH (ref 65–99)
Potassium: 3.5 mmol/L (ref 3.5–5.2)
Sodium: 144 mmol/L (ref 134–144)
Total Protein: 7.4 g/dL (ref 6.0–8.5)

## 2018-10-12 LAB — HCV COMMENT:

## 2018-10-12 LAB — HEPATITIS C ANTIBODY (REFLEX): HCV Ab: <0.1 s/co ratio (ref 0.0–0.9)

## 2018-10-12 LAB — HEMOGLOBIN A1C
Est. average glucose Bld gHb Est-mCnc: 151 mg/dL
Hgb A1c MFr Bld: 6.9 % — ABNORMAL HIGH (ref 4.8–5.6)

## 2018-10-12 MED ORDER — TRUE METRIX METER W/DEVICE KIT: PACK | 0 refills | Status: DC

## 2018-10-12 MED ORDER — TRUEPLUS LANCETS 28G MISC: 1 refills | Status: DC

## 2018-10-12 MED ORDER — TRUE METRIX BLOOD GLUCOSE TEST VI STRP: ORAL_STRIP | 12 refills | Status: DC

## 2018-10-12 MED ORDER — METFORMIN HCL 500 MG PO TABS: 500.0000 mg | ORAL_TABLET | Freq: Every day | ORAL | 1 refills | Status: DC

## 2018-10-12 MED FILL — TRUE METRIX TEST STRIP: 25 days supply | Qty: 100 | Fill #0

## 2018-10-12 MED FILL — metFORMIN HCL 500 MG TABS: 500 | 30 days supply | Qty: 30 | Fill #0

## 2018-10-12 MED FILL — TRUEplus LANCETS 28G MISC: 50 days supply | Qty: 100 | Fill #0

## 2018-10-12 MED FILL — !TRUE METRIX BLOOD GLUCOSE: 30 days supply | Qty: 1 | Fill #0

## 2018-10-12 NOTE — Telephone Encounter (Signed)
Pt with DM type 2 HgA1C 6.9   Will start metformin 500mg  daily Will rx meter and supplies.    F/u with dr Wynetta Emery in October scheduled.  Daughter aware of all lab results

## 2018-11-09 ENCOUNTER — Other Ambulatory Visit: Payer: Self-pay | Admitting: Critical Care Medicine

## 2018-11-09 MED FILL — metFORMIN HCL 500 MG TABS: 500 | 30 days supply | Qty: 30 | Fill #1

## 2018-11-09 MED FILL — DILTIAZEM 24HR CD 240 MG CA: 240 | 30 days supply | Qty: 30 | Fill #1

## 2018-11-09 MED FILL — ELIQUIS 5 MG TABLET: 5 | 30 days supply | Qty: 60 | Fill #1

## 2018-11-09 MED FILL — LOSARTAN POTASSIUM 50 MG TA: 50 | 30 days supply | Qty: 30 | Fill #1

## 2018-11-09 MED FILL — MELOXICAM 7.5 MG TABLET: 7.5 | 30 days supply | Qty: 30 | Fill #0

## 2018-11-17 ENCOUNTER — Encounter: Payer: Self-pay | Admitting: Physical Therapy

## 2018-11-17 ENCOUNTER — Other Ambulatory Visit: Payer: Self-pay

## 2018-11-17 ENCOUNTER — Ambulatory Visit: Payer: BC Managed Care – PPO | Attending: Critical Care Medicine | Admitting: Physical Therapy

## 2018-11-17 DIAGNOSIS — M25561 Pain in right knee: Secondary | ICD-10-CM | POA: Diagnosis not present

## 2018-11-17 DIAGNOSIS — M6281 Muscle weakness (generalized): Secondary | ICD-10-CM | POA: Diagnosis not present

## 2018-11-17 DIAGNOSIS — M25562 Pain in left knee: Secondary | ICD-10-CM | POA: Insufficient documentation

## 2018-11-17 DIAGNOSIS — R2689 Other abnormalities of gait and mobility: Secondary | ICD-10-CM | POA: Insufficient documentation

## 2018-11-17 DIAGNOSIS — G8929 Other chronic pain: Secondary | ICD-10-CM | POA: Diagnosis not present
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
## 2018-11-18 ENCOUNTER — Encounter: Payer: Self-pay | Admitting: Registered"

## 2018-11-18 ENCOUNTER — Encounter: Payer: BC Managed Care – PPO | Attending: Internal Medicine | Admitting: Registered"

## 2018-11-18 DIAGNOSIS — I480 Paroxysmal atrial fibrillation: Secondary | ICD-10-CM | POA: Insufficient documentation

## 2018-11-18 DIAGNOSIS — E119 Type 2 diabetes mellitus without complications: Secondary | ICD-10-CM | POA: Insufficient documentation
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
## 2018-11-18 NOTE — Patient Instructions (Addendum)
Consider having 3 balanced meals per day. Consider having no more than 1 c rice or pasta Continue taking medication as prescribed Consider checking blood sugar 1x day, alternate days with fasting and then 2 hrs after a meal. Talk to Dr. Wynetta Emery about the pins & needles sensation in your feet.

## 2018-11-18 NOTE — Progress Notes (Signed)
Cardiology Office Note   Date:  11/19/2018   ID:  Shannon Khan 1960-09-26, MRN [id number]  PCP:  Ladell Pier, MD  Cardiologist:   Minus Breeding, MD   Chief Complaint  Patient presents with  . Atrial Fibrillation      History of Present Illness: Shannon Khan is a 58 y.o. female who is referred by Dr. Joya Gaskins for evaluation of atrial fib.  She was seen in the hospital by Dr. Stanford Breed.  She had atrial fib and was rate controlled and anticoagulated.  The plan was for possible cardioversion.  However, she spontaneously converted to NSR.      She comes today with her daughter who is a cardiac ICU nurse.  The patient denies any new symptoms such as chest discomfort, neck or arm discomfort. There has been no new shortness of breath, PND or orthopnea. There have been no reported palpitations, presyncope or syncope.   She has not felt any of the same arrhythmia that she had previously   Past Medical History:  Diagnosis Date  . Hypertension     Past Surgical History:  Procedure Laterality Date  . ORIF TIBIA & FIBULA FRACTURES Left 2012  . THYROIDECTOMY  1990   for goiter     Current Outpatient Medications  Medication Sig Dispense Refill  . apixaban (ELIQUIS) 5 MG TABS tablet Take 1 tablet (5 mg total) by mouth 2 (two) times daily. 60 tablet 1  . Blood Glucose Monitoring Suppl (TRUE METRIX METER) w/Device KIT Use to measure blood sugar twice a day 1 kit 0  . diltiazem (CARDIZEM CD) 240 MG 24 hr capsule Take 1 capsule (240 mg total) by mouth daily. 30 capsule 1  . glucose blood (TRUE METRIX BLOOD GLUCOSE TEST) test strip Use as instructed 100 each 12  . losartan (COZAAR) 100 MG tablet Take 1 tablet (100 mg total) by mouth daily. 30 tablet 11  . meloxicam (MOBIC) 7.5 MG tablet TAKE 1 TABLET (7.5 MG TOTAL) BY MOUTH DAILY. 30 tablet 0  . metFORMIN (GLUCOPHAGE) 500 MG tablet Take 1 tablet (500 mg total) by mouth daily with breakfast. 60 tablet 1  . TRUEplus Lancets 28G  MISC Use to measure blood sugar twice a day 100 each 1   No current facility-administered medications for this visit.     Allergies:   Patient has no known allergies.    ROS:  Please see the history of present illness.   Otherwise, review of systems are positive for none.   All other systems are reviewed and negative.    PHYSICAL EXAM: VS:  BP (!) 158/99   Pulse 79   Ht 5' (1.524 m)   Wt 230 lb 12.8 oz (104.7 kg)   BMI 45.08 kg/m  , BMI Body mass index is 45.08 kg/m. GENERAL:  Well appearing BACK:  No CVA tenderness CHEST:  Unremarkable HEART:  PMI not displaced or sustained,S1 and S2 within normal limits, no S3, no S4, no clicks, no rubs, no murmurs ABD:  Flat, positive bowel sounds normal in frequency in pitch, no bruits, no rebound, no guarding, no midline pulsatile mass, no hepatomegaly, no splenomegaly EXT:  2 plus pulses throughout, no edema, no cyanosis no clubbing SKIN:  No rashes no nodules    EKG:  EKG is not ordered today.     Recent Labs: 09/17/2018: B Natriuretic Peptide 252.2; Magnesium 2.0; TSH 2.915 10/11/2018: ALT 20; BUN 12; Creatinine, Ser 0.75; Hemoglobin 15.3; Platelets 193; Potassium 3.5; Sodium  144    Lipid Panel    Component Value Date/Time   CHOL 258 (H) 06/14/2018 1538   TRIG 92 06/14/2018 1538   HDL 64 06/14/2018 1538   CHOLHDL 4.0 06/14/2018 1538   LDLCALC 176 (H) 06/14/2018 1538      Wt Readings from Last 3 Encounters:  11/19/18 230 lb 12.8 oz (104.7 kg)  11/19/18 231 lb 6.4 oz (105 kg)  10/11/18 227 lb 3.2 oz (103.1 kg)      Other studies Reviewed: Additional studies/ records that were reviewed today include: Hospital recrods. Review of the above records demonstrates:  Please see elsewhere in the note.     ASSESSMENT AND PLAN:  PAF:  She has had no paroxysms but statistically this very likely could be happening.  I had this long discussion about the patient and her daughter about this.  Shannon Khan has a CHA2DS2 - VASc  score of 2  .    She will continue with anticoagulation.  HTN:   Blood pressure diastolic is mildly elevated.  She had her Cozaar increased today by her primary provider.  No change in therapy.  Current medicines are reviewed at length with the patient today.  The patient does not have concerns regarding medicines.  The following changes have been made:  no change  Labs/ tests ordered today include: None No orders of the defined types were placed in this encounter.    Disposition:   FU with me in one year.     Signed, Minus Breeding, MD  11/19/2018 2:41 PM    [facility]

## 2018-11-19 ENCOUNTER — Encounter: Payer: Self-pay | Admitting: Internal Medicine

## 2018-11-19 ENCOUNTER — Ambulatory Visit: Payer: BC Managed Care – PPO | Attending: Internal Medicine | Admitting: Internal Medicine

## 2018-11-19 ENCOUNTER — Ambulatory Visit: Payer: BC Managed Care – PPO | Admitting: Cardiology

## 2018-11-19 ENCOUNTER — Other Ambulatory Visit: Payer: Self-pay

## 2018-11-19 ENCOUNTER — Encounter: Payer: Self-pay | Admitting: Cardiology

## 2018-11-19 VITALS — BP 158/99 | HR 79 | Ht 60.0 in | Wt 230.8 lb

## 2018-11-19 VITALS — BP 156/91 | HR 77 | Temp 99.6°F | Resp 16 | Wt 231.4 lb

## 2018-11-19 DIAGNOSIS — Z6841 Body Mass Index (BMI) 40.0 and over, adult: Secondary | ICD-10-CM | POA: Insufficient documentation

## 2018-11-19 DIAGNOSIS — R319 Hematuria, unspecified: Secondary | ICD-10-CM | POA: Diagnosis not present

## 2018-11-19 DIAGNOSIS — E1142 Type 2 diabetes mellitus with diabetic polyneuropathy: Secondary | ICD-10-CM | POA: Diagnosis not present

## 2018-11-19 DIAGNOSIS — R202 Paresthesia of skin: Secondary | ICD-10-CM

## 2018-11-19 DIAGNOSIS — I480 Paroxysmal atrial fibrillation: Secondary | ICD-10-CM | POA: Diagnosis not present

## 2018-11-19 DIAGNOSIS — I10 Essential (primary) hypertension: Secondary | ICD-10-CM | POA: Diagnosis not present

## 2018-11-19 DIAGNOSIS — R200 Anesthesia of skin: Secondary | ICD-10-CM | POA: Diagnosis not present

## 2018-11-19 DIAGNOSIS — Z1231 Encounter for screening mammogram for malignant neoplasm of breast: Secondary | ICD-10-CM

## 2018-11-19 LAB — GLUCOSE, POCT (MANUAL RESULT ENTRY): POC Glucose: 105 mg/dl — AB (ref 70–99)

## 2018-11-19 MED ORDER — LOSARTAN POTASSIUM 100 MG PO TABS: 100.0000 mg | ORAL_TABLET | Freq: Every day | ORAL | 11 refills | Status: DC

## 2018-11-19 MED FILL — LOSARTAN POTASSIUM 100 MG T: 100 | 30 days supply | Qty: 30 | Fill #0

## 2018-11-19 NOTE — Progress Notes (Signed)
Patient ID: Shannon Khan, female    DOB: 1960-05-02  MRN: [id number]  CC: Follow-up (1 month )   Subjective: Shannon Khan is a 58 y.o. female who presents for chronic ds management Her concerns today include:  p's daughter, Shannon Khan, is with her and interprets.  Pt speaks and understands some english Patient with history of HTN, obesity, excision of goiter, OA knees, A.fib, new DM  Since last visit with me.  Patient was hospitalized and found to be in new onset atrial fibrillation.  She was placed on Eliquis.  She had a follow-up visit with Dr. Joya Gaskins.  Found to have new diabetes and started on Metformin.  A.fib: no bruising or bleeding on Elaquis Some redness of urine this a.m.  No dysuria No palpitations No CP.  Has an appointment today with cardiologist Dr. Percival Spanish.  HTN: compliant with medications that are Cozaar and diltiazem.  She limits salt in the foods.  Denies any chest pains or shortness of breath.  No lower extremity swelling. Checks blood pressure daily.  She has a log with her.  Diastolic blood pressure has remained above 80.  Systolic blood pressure has been good.    DM: checking BS BID before meals and brings in readings with her today.  Morning blood sugar readings have been between 120 and at times as high as 160.  Saw nutritionist yesterday She was skipping lunch.  Nutritionist recommends 3 meals a day Went to [place] this wk Doing exercises on her own  +numbness and tingling in feet She is due for eye exam but her current insurance does not cover it.  We will have new insurance as of the first of next year and we will plan to get an eye exam done at that time. Patient Active Problem List   Diagnosis Date Noted  . PAF (paroxysmal atrial fibrillation) ([place]) 11/18/2018  . DM (diabetes mellitus), type 2 ([place]) 10/12/2018  . Hyperglycemia 10/11/2018  . Imbalance 10/11/2018  . On continuous oral anticoagulation 10/11/2018  . Atrial fibrillation with RVR  ([place]) 09/17/2018  . Essential hypertension 06/14/2018  . Obesity (BMI 30-39.9) 06/14/2018  . History of thyroidectomy 06/14/2018  . Chronic pain of both knees 06/14/2018     Current Outpatient Medications on File Prior to Visit  Medication Sig Dispense Refill  . apixaban (ELIQUIS) 5 MG TABS tablet Take 1 tablet (5 mg total) by mouth 2 (two) times daily. 60 tablet 1  . Blood Glucose Monitoring Suppl (TRUE METRIX METER) w/Device KIT Use to measure blood sugar twice a day 1 kit 0  . diltiazem (CARDIZEM CD) 240 MG 24 hr capsule Take 1 capsule (240 mg total) by mouth daily. 30 capsule 1  . glucose blood (TRUE METRIX BLOOD GLUCOSE TEST) test strip Use as instructed 100 each 12  . losartan (COZAAR) 50 MG tablet Take 1 tablet (50 mg total) by mouth daily. 30 tablet 11  . meloxicam (MOBIC) 7.5 MG tablet TAKE 1 TABLET (7.5 MG TOTAL) BY MOUTH DAILY. 30 tablet 0  . metFORMIN (GLUCOPHAGE) 500 MG tablet Take 1 tablet (500 mg total) by mouth daily with breakfast. 60 tablet 1  . TRUEplus Lancets 28G MISC Use to measure blood sugar twice a day 100 each 1   No current facility-administered medications on file prior to visit.     No Known Allergies  Social History   Socioeconomic History  . Marital status: Single    Spouse name: Not on file  . Number of  children: 3  . Years of education: completed high school  . Highest education level: Not on file  Occupational History  . Not on file  Social Needs  . Financial resource strain: Not on file  . Food insecurity    Worry: Not on file    Inability: Not on file  . Transportation needs    Medical: Not on file    Non-medical: Not on file  Tobacco Use  . Smoking status: Never Smoker  . Smokeless tobacco: Never Used  Substance and Sexual Activity  . Alcohol use: Not on file    Comment: occasional wine  . Drug use: Never  . Sexual activity: Not on file  Lifestyle  . Physical activity    Days per week: Not on file    Minutes per session: Not on  file  . Stress: Not on file  Relationships  . Social Herbalist on phone: Not on file    Gets together: Not on file    Attends religious service: Not on file    Active member of club or organization: Not on file    Attends meetings of clubs or organizations: Not on file    Relationship status: Not on file  . Intimate partner violence    Fear of current or ex partner: Not on file    Emotionally abused: Not on file    Physically abused: Not on file    Forced sexual activity: Not on file  Other Topics Concern  . Not on file  Social History Narrative  . Not on file    Family History  Problem Relation Age of Onset  . Hypertension Mother   . Hypertension Father   . Stroke Father   . Diabetes Paternal Aunt     Past Surgical History:  Procedure Laterality Date  . ORIF TIBIA & FIBULA FRACTURES Left 2012  . THYROIDECTOMY  1990   for goiter    ROS: Review of Systems Negative except as stated above  PHYSICAL EXAM: BP (!) 156/91   Pulse 77   Temp 99.6 F (37.6 C) (Oral)   Resp 16   Wt 231 lb 6.4 oz (105 kg)   SpO2 96%   BMI 45.19 kg/m   Physical Exam  General appearance - alert, well appearing, and in no distress Mental status - normal mood, behavior, speech, dress, motor activity, and thought processes Neck - supple, no significant adenopathy Chest - clear to auscultation, no wheezes, rales or rhonchi, symmetric air entry Heart - normal rate, regular rhythm, normal S1, S2, no murmurs, rubs, clicks or gallops Extremities - peripheral pulses normal, no pedal edema, no clubbing or cyanosi Diabetic Foot Exam - Simple   Simple Foot Form Visual Inspection No deformities, no ulcerations, no other skin breakdown bilaterally: Yes Sensation Testing Intact to touch and monofilament testing bilaterally: Yes Pulse Check Posterior Tibialis and Dorsalis pulse intact bilaterally: Yes Comments     CMP Latest Ref Rng & Units 10/11/2018 09/17/2018 09/17/2018  Glucose 65  - 99 mg/dL 124(H) - 161(H)  BUN 8 - 27 mg/dL 12 - 13  Creatinine 0.57 - 1.00 mg/dL 0.75 - 0.96  Sodium 134 - 144 mmol/L 144 - 140  Potassium 3.5 - 5.2 mmol/L 3.5 3.2(L) 2.6(LL)  Chloride 96 - 106 mmol/L 104 - 104  CO2 20 - 29 mmol/L 26 - 24  Calcium 8.7 - 10.3 mg/dL 9.9 - 9.1  Total Protein 6.0 - 8.5 g/dL 7.4 - 6.8  Total  Bilirubin 0.0 - 1.2 mg/dL 0.3 - 0.9  Alkaline Phos 39 - 117 IU/L 103 - 76  AST 0 - 40 IU/L 17 - 26  ALT 0 - 32 IU/L 20 - 26   Lipid Panel     Component Value Date/Time   CHOL 258 (H) 06/14/2018 1538   TRIG 92 06/14/2018 1538   HDL 64 06/14/2018 1538   CHOLHDL 4.0 06/14/2018 1538   LDLCALC 176 (H) 06/14/2018 1538    CBC    Component Value Date/Time   WBC 7.0 10/11/2018 1127   WBC 9.8 09/17/2018 0337   RBC 5.44 (H) 10/11/2018 1127   RBC 4.74 09/17/2018 0337   HGB 15.3 10/11/2018 1127   HCT 45.7 10/11/2018 1127   PLT 193 10/11/2018 1127   MCV 84 10/11/2018 1127   MCH 28.1 10/11/2018 1127   MCH 28.9 09/17/2018 0337   MCHC 33.5 10/11/2018 1127   MCHC 33.8 09/17/2018 0337   RDW 13.1 10/11/2018 1127   LYMPHSABS 3.5 (H) 10/11/2018 1127   MONOABS 0.5 09/17/2018 0337   EOSABS 0.1 10/11/2018 1127   BASOSABS 0.1 10/11/2018 1127   Lab Results  Component Value Date   HGBA1C 6.9 (H) 10/11/2018     ASSESSMENT AND PLAN: 1. Type 2 diabetes mellitus with diabetic polyneuropathy, without long-term current use of insulin (HCC) We decided to make no change in Metformin at this time.  She will continue to monitor blood sugars.  Encouraged to move as much as she can. Plan to get eye exam early next year when she has new insurance - POCT glucose (manual entry) - Microalbumin / creatinine urine ratio  2. Essential hypertension Not at goal.  Increase Cozaar to 100 mg daily. - losartan (COZAAR) 100 MG tablet; Take 1 tablet (100 mg total) by mouth daily.  Dispense: 30 tablet; Refill: 11  3. Class 3 severe obesity due to excess calories with serious comorbidity and  body mass index (BMI) of 45.0 to 49.9 in adult [facility]) See #1 above  4. Hematuria, unspecified type Patient thinks her urine was a little reddish today.  We will send off a urinalysis - Urinalysis  5. Numbness and tingling of both feet Likely due to diabetic neuropathy.  However we will check a B12 level.  Discussed with patient that we can start him on medication to help decrease the symptoms but she does not feel that the symptoms are too worrisome at this time - Vitamin B12  6. Encounter for screening mammogram for malignant neoplasm of breast - MM Digital Screening; Future     Patient was given the opportunity to ask questions.  Patient verbalized understanding of the plan and was able to repeat key elements of the plan.   Orders Placed This Encounter  Procedures  . Microalbumin / creatinine urine ratio  . POCT glucose (manual entry)     Requested Prescriptions    No prescriptions requested or ordered in this encounter    No follow-ups on file.  Karle Plumber, MD, FACP

## 2018-11-19 NOTE — Patient Instructions (Signed)
Increase Cozaar to 100 mg daily

## 2018-11-19 NOTE — Patient Instructions (Addendum)
Medication Instructions:  Your physician recommends that you continue on your current medications as directed. Please refer to the Current Medication list given to you today.  If you need a refill on your cardiac medications before your next appointment, please call your pharmacy.   Lab work: NONE  Testing/Procedures: NONE  Follow-Up: At [facility], you and your health needs are our priority.  As part of our continuing mission to provide you with exceptional heart care, we have created designated Provider Care Teams.  These Care Teams include your primary Cardiologist (physician) and Advanced Practice Providers (APPs -  Physician Assistants and Nurse Practitioners) who all work together to provide you with the care you need, when you need it. You may see Dr Hochrein or one of the following Advanced Practice Providers on your designated Care Team:    Rhonda Barrett, PA-C  Kathryn Lawrence, DNP, ANP  Cadence Furth, NP  Your physician wants you to follow-up in: 1 year. You will receive a reminder letter in the mail two months in advance. If you don't receive a letter, please call our office to schedule the follow-up appointment.       

## 2018-11-20 LAB — URINALYSIS
Bilirubin, UA: NEGATIVE
Glucose, UA: NEGATIVE
Ketones, UA: NEGATIVE
Nitrite, UA: NEGATIVE
Specific Gravity, UA: 1.017 (ref 1.005–1.030)
Urobilinogen, Ur: 0.2 mg/dL (ref 0.2–1.0)
pH, UA: 7.5 (ref 5.0–7.5)

## 2018-11-20 LAB — VITAMIN B12: Vitamin B-12: 913 pg/mL (ref 232–1245)

## 2018-11-20 LAB — MICROALBUMIN / CREATININE URINE RATIO
Creatinine, Urine: 109.8 mg/dL
Microalb/Creat Ratio: 80 mg/g creat — ABNORMAL HIGH (ref 0–29)
Microalbumin, Urine: 88 ug/mL

## 2018-11-21 ENCOUNTER — Other Ambulatory Visit: Payer: Self-pay | Admitting: Internal Medicine

## 2018-11-21 DIAGNOSIS — R319 Hematuria, unspecified: Secondary | ICD-10-CM

## 2018-11-25 ENCOUNTER — Telehealth: Payer: Self-pay | Admitting: Internal Medicine

## 2018-11-25 NOTE — Telephone Encounter (Signed)
Patient daughter called requesting to speak with the nurse regarding her urinalysis results. Patient daughter has some questions regarding her results. Please f/u

## 2018-11-25 NOTE — Telephone Encounter (Signed)
Returned pt daughter call. Pt is schedule for a repeat urine on Monday

## 2018-11-26 ENCOUNTER — Encounter

## 2018-11-29 ENCOUNTER — Ambulatory Visit: Payer: BC Managed Care – PPO | Attending: Internal Medicine

## 2018-11-29 DIAGNOSIS — R319 Hematuria, unspecified: Secondary | ICD-10-CM | POA: Diagnosis not present

## 2018-11-29 MED FILL — LOSARTAN POTASSIUM 100 MG T: 100 | 30 days supply | Qty: 30 | Fill #0

## 2018-11-29 MED FILL — TRUE METRIX TEST STRIP: 25 days supply | Qty: 100 | Fill #1

## 2018-11-30 LAB — URINALYSIS, ROUTINE W REFLEX MICROSCOPIC
Bilirubin, UA: NEGATIVE
Glucose, UA: NEGATIVE
Ketones, UA: NEGATIVE
Nitrite, UA: NEGATIVE
Specific Gravity, UA: 1.015 (ref 1.005–1.030)
Urobilinogen, Ur: 0.2 mg/dL (ref 0.2–1.0)
pH, UA: 8 — ABNORMAL HIGH (ref 5.0–7.5)

## 2018-11-30 LAB — MICROSCOPIC EXAMINATION
Casts: NONE SEEN /lpf
RBC, Urine: >30 /hpf — AB (ref 0–2)

## 2018-12-01 ENCOUNTER — Telehealth: Payer: Self-pay | Admitting: Internal Medicine

## 2018-12-01 ENCOUNTER — Ambulatory Visit: Payer: BC Managed Care – PPO | Attending: Critical Care Medicine | Admitting: Physical Therapy

## 2018-12-01 ENCOUNTER — Other Ambulatory Visit: Payer: Self-pay

## 2018-12-01 ENCOUNTER — Encounter: Payer: Self-pay | Admitting: Internal Medicine

## 2018-12-01 ENCOUNTER — Other Ambulatory Visit: Payer: Self-pay | Admitting: Internal Medicine

## 2018-12-01 DIAGNOSIS — G8929 Other chronic pain: Secondary | ICD-10-CM | POA: Diagnosis present

## 2018-12-01 DIAGNOSIS — M25561 Pain in right knee: Secondary | ICD-10-CM | POA: Insufficient documentation

## 2018-12-01 DIAGNOSIS — M6281 Muscle weakness (generalized): Secondary | ICD-10-CM | POA: Insufficient documentation

## 2018-12-01 DIAGNOSIS — R2689 Other abnormalities of gait and mobility: Secondary | ICD-10-CM | POA: Diagnosis present

## 2018-12-01 DIAGNOSIS — M25562 Pain in left knee: Secondary | ICD-10-CM | POA: Insufficient documentation

## 2018-12-01 DIAGNOSIS — R319 Hematuria, unspecified: Secondary | ICD-10-CM

## 2018-12-01 MED ORDER — CIPROFLOXACIN HCL 500 MG PO TABS: 500.0000 mg | ORAL_TABLET | Freq: Two times a day (BID) | ORAL | 0 refills | Status: DC

## 2018-12-01 MED FILL — CIPROFLOXACIN HCL 500 MG TA: 500 | 7 days supply | Qty: 14 | Fill #0

## 2018-12-01 NOTE — Therapy (Signed)
[place], [place], [address] Phone: [phone]   Fax:  [phone]  Physical Therapy Treatment  Patient Details  Name: Shannon Khan MRN: [id number] Date of Birth: April 26, 1960 Referring Provider (PT): Elsie Stain, MD   Encounter Date: 12/01/2018  PT End of Session - 12/01/18 1252    Visit Number  2    Number of Visits  17    Date for PT Re-Evaluation  01/14/19    Authorization Type  BCBS    PT Start Time  1230    PT Stop Time  1314    PT Time Calculation (min)  44 min       Past Medical History:  Diagnosis Date  . Hypertension     Past Surgical History:  Procedure Laterality Date  . ORIF TIBIA & FIBULA FRACTURES Left 2012  . THYROIDECTOMY  1990   for goiter    There were no vitals filed for this visit.                    [facility] Adult PT Treatment/Exercise - 12/01/18 0001      Ambulation/Gait   Gait velocity  3.9 ft/sec   78 ft in 20 sec   Gait Comments  6MWT: 850FT (rest break at end for 65 seconds )      Knee/Hip Exercises: Stretches   Passive Hamstring Stretch Limitations  seated in chair   verses long sitting   Gastroc Stretch Limitations  lunge at wall   needs cues to perform correctly.      Knee/Hip Exercises: Standing   Heel Raises  20 reps    Forward Step Up  1 set;10 reps;Hand Hold: 1;Step Height: 6"    Forward Step Up Limitations  each     SLS  3-5 s best     Other Standing Knee Exercises  tandem stance 10-15 sec best       Knee/Hip Exercises: Seated   Sit to Sand  10 reps   without UE and cues for control             PT Education - 12/01/18 1414    Education Details  HEP    Person(s) Educated  Patient    Methods  Explanation;Handout    Comprehension  Verbalized understanding          PT Long Term Goals - 11/17/18 1506      PT LONG TERM GOAL #1   Title  5TSTS to 11s    Baseline  17s at eval    Time  8    Period  Weeks    Status  New    Target Date  01/14/19      PT LONG TERM GOAL #2   Title  BERG to 45/56    Baseline  40/56 at eval    Time  8    Period  Weeks    Status  New    Target Date  01/14/19      PT LONG TERM GOAL #3   Title  6MWT to improve by [place] (190 ft)    Baseline  to be tested at visit 2    Time  8    Period  Weeks    Status  New    Target Date  01/14/19      PT LONG TERM GOAL #4   Title  pt will be able to walk around [employer] without rest break  Baseline  requried at eval    Time  8    Period  Weeks    Status  New    Target Date  01/14/19      PT LONG TERM GOAL #5   Title  pt will be able to ambulate household and short distances without cane    Baseline  full time use at eval    Time  8    Period  Weeks    Status  New    Target Date  01/14/19            Plan - 12/01/18 1415    Clinical Impression Statement  Pt arrives without her daughter and has difficulty understanding English. Her daughter was asked to come inside and interpret and she will attend future sessions. 6 MWT performed with SPC, she requested to sit after 4 min and 36 seconds due to fatigue. Reviewed stretches from HEP which she needed cues to perform correctly. Progressed with LE strengthening and balance. Updated HEP. No c/o pain during session.    PT Next Visit Plan  CKC strengthening, balance    PT Home Exercise Plan  continue home exercises, seated HSS, gastroc stretch, tandem stance, SLS at counter, sit-stand       Patient will benefit from skilled therapeutic intervention in order to improve the following deficits and impairments:  Difficulty walking, Decreased endurance, Decreased activity tolerance, Pain, Improper body mechanics, Impaired flexibility, Decreased balance, Decreased strength  Visit Diagnosis: Other abnormalities of gait and mobility  Muscle weakness (generalized)  Chronic pain of right knee  Chronic pain of left knee     Problem List Patient Active Problem List    Diagnosis Date Noted  . Class 3 severe obesity due to excess calories with serious comorbidity and body mass index (BMI) of 45.0 to 49.9 in adult (HCC) 11/19/2018  . Numbness and tingling of both feet 11/19/2018  . PAF (paroxysmal atrial fibrillation) (HCC) 11/18/2018  . DM (diabetes mellitus), type 2 (HCC) 10/12/2018  . Hyperglycemia 10/11/2018  . Imbalance 10/11/2018  . On continuous oral anticoagulation 10/11/2018  . Atrial fibrillation with RVR (HCC) 09/17/2018  . Essential hypertension 06/14/2018  . Obesity (BMI 30-39.9) 06/14/2018  . History of thyroidectomy 06/14/2018  . Chronic pain of both knees 06/14/2018    [name], [place] 12/01/2018, 2:20 PM  [facility] [address] [place], [place], [address] Phone: [phone]   Fax:  [phone]  Name: Sharaya Boruff MRN: [id number] Date of Birth: 1960/09/16

## 2018-12-01 NOTE — Telephone Encounter (Signed)
PC placed to pt.  I received VM.  I left message stating who I am and that I sent a message to her Mychart account that I would like for her to take a look at.

## 2018-12-02 ENCOUNTER — Ambulatory Visit: Payer: BC Managed Care – PPO | Admitting: Physical Therapy

## 2018-12-02 ENCOUNTER — Encounter: Payer: Self-pay | Admitting: Physical Therapy

## 2018-12-02 DIAGNOSIS — G8929 Other chronic pain: Secondary | ICD-10-CM

## 2018-12-02 DIAGNOSIS — M25562 Pain in left knee: Secondary | ICD-10-CM

## 2018-12-02 DIAGNOSIS — R2689 Other abnormalities of gait and mobility: Secondary | ICD-10-CM | POA: Diagnosis not present

## 2018-12-02 DIAGNOSIS — M6281 Muscle weakness (generalized): Secondary | ICD-10-CM

## 2018-12-02 NOTE — Therapy (Signed)
[place], [place], [address] Phone: [phone]   Fax:  [phone]  Physical Therapy Treatment  Patient Details  Name: Shannon Khan MRN: [id number] Date of Birth: 22-Apr-1960 Referring Provider (PT): Elsie Stain, MD   Encounter Date: 12/02/2018  PT End of Session - 12/02/18 1038    Visit Number  3    Date for PT Re-Evaluation  01/14/19    Authorization Type  BCBS    PT Start Time  0936    PT Stop Time  2423    PT Time Calculation (min)  38 min       Past Medical History:  Diagnosis Date  . Hypertension     Past Surgical History:  Procedure Laterality Date  . ORIF TIBIA & FIBULA FRACTURES Left 2012  . THYROIDECTOMY  1990   for goiter    There were no vitals filed for this visit.  Subjective Assessment - 12/02/18 0941    Subjective  No pain today. No soreness after yesterday session.    Currently in Pain?  No/denies                       [facility] Adult PT Treatment/Exercise - 12/02/18 0001      Knee/Hip Exercises: Stretches   Passive Hamstring Stretch Limitations  seated in chair   verses long sitting   Hip Flexor Stretch  3 reps;10 seconds    Hip Flexor Stretch Limitations  leg off table     Gastroc Stretch Limitations  lunge at wall   needs cues to perform correctly.      Knee/Hip Exercises: Aerobic   Tread Mill  attempted, discontinued , pt has never walked on treadmill.     Nustep  L3 UE/LE x 5 minutes       Knee/Hip Exercises: Standing   Heel Raises  20 reps    SLS  5-7 sec best bilat     Other Standing Knee Exercises  tandem stance 30 sec each best       Knee/Hip Exercises: Seated   Sit to Sand  2 sets;10 reps   without UE and cues for control      Knee/Hip Exercises: Supine   Bridges  10 reps                  PT Long Term Goals - 11/17/18 1506      PT LONG TERM GOAL #1   Title  5TSTS to 11s    Baseline  17s at eval    Time  8    Period  Weeks    Status  New    Target Date  01/14/19      PT LONG TERM GOAL #2   Title  BERG to 45/56    Baseline  40/56 at eval    Time  8    Period  Weeks    Status  New    Target Date  01/14/19      PT LONG TERM GOAL #3   Title  6MWT to improve by [place] (190 ft)    Baseline  to be tested at visit 2    Time  8    Period  Weeks    Status  New    Target Date  01/14/19      PT LONG TERM GOAL #4   Title  pt will be able to walk around [employer] without rest break  Baseline  requried at eval    Time  8    Period  Weeks    Status  New    Target Date  01/14/19      PT LONG TERM GOAL #5   Title  pt will be able to ambulate household and short distances without cane    Baseline  full time use at eval    Time  8    Period  Weeks    Status  New    Target Date  01/14/19            Plan - 12/02/18 1035    Clinical Impression Statement  Pt fatigued more quickly today. Her daughter thinks it is due to a big breakfast this morning. Began mat bridges and hip flexor stretch. Some pain with initial reps of these which resolved with more repetitions.    PT Next Visit Plan  CKC strengthening, balance    PT Home Exercise Plan  continue home exercises, seated HSS, gastroc stretch, tandem stance, SLS at counter, sit-stand       Patient will benefit from skilled therapeutic intervention in order to improve the following deficits and impairments:  Difficulty walking, Decreased endurance, Decreased activity tolerance, Pain, Improper body mechanics, Impaired flexibility, Decreased balance, Decreased strength  Visit Diagnosis: Other abnormalities of gait and mobility  Muscle weakness (generalized)  Chronic pain of right knee  Chronic pain of left knee     Problem List Patient Active Problem List   Diagnosis Date Noted  . Class 3 severe obesity due to excess calories with serious comorbidity and body mass index (BMI) of 45.0 to 49.9 in adult (HCC) 11/19/2018  . Numbness and  tingling of both feet 11/19/2018  . PAF (paroxysmal atrial fibrillation) (HCC) 11/18/2018  . DM (diabetes mellitus), type 2 (HCC) 10/12/2018  . Hyperglycemia 10/11/2018  . Imbalance 10/11/2018  . On continuous oral anticoagulation 10/11/2018  . Atrial fibrillation with RVR (HCC) 09/17/2018  . Essential hypertension 06/14/2018  . Obesity (BMI 30-39.9) 06/14/2018  . History of thyroidectomy 06/14/2018  . Chronic pain of both knees 06/14/2018    Shannon Khan, [place] 12/02/2018, 10:40 AM  [facility] [address] [place], [place], [address] Phone: [phone]   Fax:  [phone]  Name: Shannon Khan MRN: [id number] Date of Birth: Aug 27, 1960

## 2018-12-07 ENCOUNTER — Other Ambulatory Visit: Payer: Self-pay

## 2018-12-07 ENCOUNTER — Ambulatory Visit: Payer: BC Managed Care – PPO | Admitting: Physical Therapy

## 2018-12-07 ENCOUNTER — Encounter: Payer: Self-pay | Admitting: Physical Therapy

## 2018-12-07 DIAGNOSIS — M6281 Muscle weakness (generalized): Secondary | ICD-10-CM

## 2018-12-07 DIAGNOSIS — G8929 Other chronic pain: Secondary | ICD-10-CM

## 2018-12-07 DIAGNOSIS — R2689 Other abnormalities of gait and mobility: Secondary | ICD-10-CM | POA: Diagnosis not present

## 2018-12-07 DIAGNOSIS — M25561 Pain in right knee: Secondary | ICD-10-CM

## 2018-12-07 NOTE — Therapy (Signed)
[place], [place], [address] Phone: [phone]   Fax:  [phone]  Physical Therapy Treatment  Patient Details  Name: Shannon Khan MRN: [id number] Date of Birth: 05/26/1960 Referring Provider (PT): Elsie Stain, MD   Encounter Date: 12/07/2018  PT End of Session - 12/07/18 1058    Visit Number  4    Number of Visits  17    Date for PT Re-Evaluation  01/14/19    Authorization Type  BCBS    PT Start Time  1055    PT Stop Time  1139    PT Time Calculation (min)  44 min       Past Medical History:  Diagnosis Date  . Hypertension     Past Surgical History:  Procedure Laterality Date  . ORIF TIBIA & FIBULA FRACTURES Left 2012  . THYROIDECTOMY  1990   for goiter    There were no vitals filed for this visit.  Subjective Assessment - 12/07/18 1057    Subjective  Pt reports some right posterior thigh pain after doing her HEP.    Currently in Pain?  No/denies                       [facility] Adult PT Treatment/Exercise - 12/07/18 0001      Knee/Hip Exercises: Stretches   Passive Hamstring Stretch Limitations  seated in chair   verses long sitting   Hip Flexor Stretch  3 reps;10 seconds    Hip Flexor Stretch Limitations  leg off table     Gastroc Stretch Limitations  lunge at wall   needs cues to perform correctly.      Knee/Hip Exercises: Aerobic   Nustep  L5 Reduced to L4)  UE/LE x 6 minutes       Knee/Hip Exercises: Standing   Heel Raises  20 reps    Forward Step Up  15 reps    Forward Step Up Limitations  each     SLS  5-7 sec best bilat     Other Standing Knee Exercises  tandem stance 30 sec each best       Knee/Hip Exercises: Seated   Sit to Sand  2 sets;10 reps   without UE and cues for control      Knee/Hip Exercises: Supine   Bridges  20 reps    Bridges Limitations  cramps in hamstrings frequently     Other Supine Knee/Hip Exercises  clams with green band                    PT Long Term Goals - 11/17/18 1506      PT LONG TERM GOAL #1   Title  5TSTS to 11s    Baseline  17s at eval    Time  8    Period  Weeks    Status  New    Target Date  01/14/19      PT LONG TERM GOAL #2   Title  BERG to 45/56    Baseline  40/56 at eval    Time  8    Period  Weeks    Status  New    Target Date  01/14/19      PT LONG TERM GOAL #3   Title  6MWT to improve by [place] (190 ft)    Baseline  to be tested at visit 2    Time  8    Period  Weeks    Status  New    Target Date  01/14/19      PT LONG TERM GOAL #4   Title  pt will be able to walk around [employer] without rest break    Baseline  requried at eval    Time  8    Period  Weeks    Status  New    Target Date  01/14/19      PT LONG TERM GOAL #5   Title  pt will be able to ambulate household and short distances without cane    Baseline  full time use at eval    Time  8    Period  Weeks    Status  New    Target Date  01/14/19            Plan - 12/07/18 1143    Clinical Impression Statement  Pt reports compliance with HEP. She reports some right posterior thigh pulling and soreness after HEP. She cramps easily today with bridging. Overall she reports less knee pain and is no longer using SPC. She ambulated at [employer] without the need for rest break this past weekend. Progressing toward LTGs.    PT Next Visit Plan  try KB exercises, dynamic balance, check goals    PT Home Exercise Plan  continue home exercises, seated HSS, gastroc stretch, tandem stance, SLS at counter, sit-stand       Patient will benefit from skilled therapeutic intervention in order to improve the following deficits and impairments:  Difficulty walking, Decreased endurance, Decreased activity tolerance, Pain, Improper body mechanics, Impaired flexibility, Decreased balance, Decreased strength  Visit Diagnosis: Other abnormalities of gait and mobility  Muscle weakness (generalized)  Chronic pain of  right knee  Chronic pain of left knee     Problem List Patient Active Problem List   Diagnosis Date Noted  . Class 3 severe obesity due to excess calories with serious comorbidity and body mass index (BMI) of 45.0 to 49.9 in adult (HCC) 11/19/2018  . Numbness and tingling of both feet 11/19/2018  . PAF (paroxysmal atrial fibrillation) (HCC) 11/18/2018  . DM (diabetes mellitus), type 2 (HCC) 10/12/2018  . Hyperglycemia 10/11/2018  . Imbalance 10/11/2018  . On continuous oral anticoagulation 10/11/2018  . Atrial fibrillation with RVR (HCC) 09/17/2018  . Essential hypertension 06/14/2018  . Obesity (BMI 30-39.9) 06/14/2018  . History of thyroidectomy 06/14/2018  . Chronic pain of both knees 06/14/2018    [name], [place] 12/07/2018, 11:46 AM  [facility] [address] [place], [place], [address] Phone: [phone]   Fax:  [phone]  Name: Shannon Khan MRN: [id number] Date of Birth: Aug 20, 1960

## 2018-12-09 ENCOUNTER — Other Ambulatory Visit: Payer: Self-pay

## 2018-12-09 ENCOUNTER — Ambulatory Visit: Payer: BC Managed Care – PPO | Admitting: Physical Therapy

## 2018-12-09 ENCOUNTER — Ambulatory Visit: Payer: BC Managed Care – PPO | Attending: Internal Medicine

## 2018-12-09 ENCOUNTER — Encounter: Payer: Self-pay | Admitting: Physical Therapy

## 2018-12-09 DIAGNOSIS — M6281 Muscle weakness (generalized): Secondary | ICD-10-CM

## 2018-12-09 DIAGNOSIS — R319 Hematuria, unspecified: Secondary | ICD-10-CM | POA: Diagnosis not present

## 2018-12-09 DIAGNOSIS — R2689 Other abnormalities of gait and mobility: Secondary | ICD-10-CM

## 2018-12-09 DIAGNOSIS — M25561 Pain in right knee: Secondary | ICD-10-CM

## 2018-12-09 DIAGNOSIS — M25562 Pain in left knee: Secondary | ICD-10-CM

## 2018-12-09 DIAGNOSIS — G8929 Other chronic pain: Secondary | ICD-10-CM

## 2018-12-09 NOTE — Therapy (Addendum)
[place], [place], [address] Phone: [phone]   Fax:  [phone]  Physical Therapy Treatment  Patient Details  Name: Shannon Khan MRN: [id number] Date of Birth: [birth year] Referring Provider (PT): Elsie Stain, MD   Encounter Date: 12/09/2018  PT End of Session - 12/09/18 1333    Visit Number  5    Number of Visits  17    Date for PT Re-Evaluation  01/14/19    Authorization Type  BCBS    PT Start Time  0130    PT Stop Time  0212    PT Time Calculation (min)  42 min       Past Medical History:  Diagnosis Date  . Hypertension     Past Surgical History:  Procedure Laterality Date  . ORIF TIBIA & FIBULA FRACTURES Left 2012  . THYROIDECTOMY  1990   for goiter    There were no vitals filed for this visit.  Subjective Assessment - 12/09/18 1332    Subjective  Doing okay. No pain today.    Currently in Pain?  No/denies                       [facility] Adult PT Treatment/Exercise - 12/09/18 0001      Knee/Hip Exercises: Stretches   Passive Hamstring Stretch Limitations  seated in chair   verses long sitting   Hip Flexor Stretch  3 reps;10 seconds    Hip Flexor Stretch Limitations  leg off table     Gastroc Stretch Limitations  lunge at wall   needs cues to perform correctly.      Knee/Hip Exercises: Standing   Heel Raises  20 reps    Forward Step Up  20 reps    Forward Step Up Limitations  each     SLS  4 sec without initial counter touch    Other Standing Knee Exercises  tandem stance 20 sec without initial counter touch       Knee/Hip Exercises: Seated   Sit to Sand  2 sets;10 reps   without UE and cues for control      Knee/Hip Exercises: Supine   Bridges  20 reps      Tandem gait, stepping over hurdles forward and lateral             PT Long Term Goals - 12/09/18 1342      PT LONG TERM GOAL #1   Title  5TSTS to 11s    Baseline  17s at eval,  10 sec 12/09/18    Time  8    Period  Weeks    Status  Achieved      PT LONG TERM GOAL #2   Title  BERG to 45/56    Baseline  40/56 at eval    Time  8    Period  Weeks    Status  Unable to assess      PT LONG TERM GOAL #3   Title  6MWT to improve by [place] (190 ft)    Baseline  to be tested at visit 2    Time  8    Period  Weeks    Status  Unable to assess      PT LONG TERM GOAL #4   Title  pt will be able to walk around [employer] without rest break    Time  8    Period  Weeks  Status  Achieved      PT LONG TERM GOAL #5   Title  pt will be able to ambulate household and short distances without cane    Time  8    Period  Weeks    Status  Achieved            Plan - 12/09/18 1409    Clinical Impression Statement  Shannon Khan is making great progress toward her goals. She has met LTG# 1,4,5. She is ambulating longer periods without rest and is not using cane or walking stick anymore. She was instructed in increased dynamic balanace challenges with use of gait belt and CGA due to occassional LOB.    PT Next Visit Plan  try KB exercises?, continue dynamic balance, check goals    PT Home Exercise Plan  continue home exercises, seated HSS, gastroc stretch, tandem stance, SLS at counter, sit-stand       Patient will benefit from skilled therapeutic intervention in order to improve the following deficits and impairments:  Difficulty walking, Decreased endurance, Decreased activity tolerance, Pain, Improper body mechanics, Impaired flexibility, Decreased balance, Decreased strength  Visit Diagnosis: Other abnormalities of gait and mobility  Muscle weakness (generalized)  Chronic pain of right knee  Chronic pain of left knee     Problem List Patient Active Problem List   Diagnosis Date Noted  . Class 3 severe obesity due to excess calories with serious comorbidity and body mass index (BMI) of 45.0 to 49.9 in adult ([place]) 11/19/2018  . Numbness and tingling of both feet  11/19/2018  . PAF (paroxysmal atrial fibrillation) ([place]) 11/18/2018  . DM (diabetes mellitus), type 2 ([place]) 10/12/2018  . Hyperglycemia 10/11/2018  . Imbalance 10/11/2018  . On continuous oral anticoagulation 10/11/2018  . Atrial fibrillation with RVR ([place]) 09/17/2018  . Essential hypertension 06/14/2018  . Obesity (BMI 30-39.9) 06/14/2018  . History of thyroidectomy 06/14/2018  . Chronic pain of both knees 06/14/2018    Dorene Ar, [place] 12/09/2018, 2:17 PM  [facility] [address] [place], [place], [address] Phone: [phone]   Fax:  [phone]  Name: Shannon Khan MRN: [id number] Date of Birth: Nov 24, 1960

## 2018-12-10 LAB — MICROSCOPIC EXAMINATION
Casts: NONE SEEN /lpf
RBC, Urine: >30 /hpf — AB (ref 0–2)

## 2018-12-10 LAB — URINALYSIS, ROUTINE W REFLEX MICROSCOPIC
Bilirubin, UA: NEGATIVE
Glucose, UA: NEGATIVE
Ketones, UA: NEGATIVE
Nitrite, UA: POSITIVE — AB
Specific Gravity, UA: 1.016 (ref 1.005–1.030)
Urobilinogen, Ur: 0.2 mg/dL (ref 0.2–1.0)
pH, UA: 7 (ref 5.0–7.5)

## 2018-12-11 LAB — URINE CULTURE

## 2018-12-12 ENCOUNTER — Other Ambulatory Visit: Payer: Self-pay | Admitting: Internal Medicine

## 2018-12-12 MED ORDER — SULFAMETHOXAZOLE-TRIMETHOPRIM 400-80 MG PO TABS: 1.0000 | ORAL_TABLET | Freq: Two times a day (BID) | ORAL | 0 refills | Status: DC

## 2018-12-13 MED FILL — TRUEplus LANCETS 28G MISC: 50 days supply | Qty: 100 | Fill #1

## 2018-12-13 MED FILL — SULFAMETHOXAZOLE-TMP DS TAB: 800-160 | 7 days supply | Qty: 14 | Fill #0

## 2018-12-13 MED FILL — metFORMIN HCL 500 MG TABS: 500 | 30 days supply | Qty: 30 | Fill #2

## 2018-12-13 MED FILL — $ELIQUIS 5 MG TABLET: 5 | 30 days supply | Qty: 60 | Fill #0

## 2018-12-13 MED FILL — DILTIAZEM 24HR ER 240 MG CA: 240 | 30 days supply | Qty: 30 | Fill #0

## 2018-12-14 ENCOUNTER — Encounter: Payer: Self-pay | Admitting: Physical Therapy

## 2018-12-14 ENCOUNTER — Other Ambulatory Visit: Payer: Self-pay

## 2018-12-14 ENCOUNTER — Ambulatory Visit: Payer: BC Managed Care – PPO | Admitting: Physical Therapy

## 2018-12-14 DIAGNOSIS — M6281 Muscle weakness (generalized): Secondary | ICD-10-CM

## 2018-12-14 DIAGNOSIS — R2689 Other abnormalities of gait and mobility: Secondary | ICD-10-CM | POA: Diagnosis not present

## 2018-12-14 DIAGNOSIS — M25562 Pain in left knee: Secondary | ICD-10-CM

## 2018-12-14 DIAGNOSIS — G8929 Other chronic pain: Secondary | ICD-10-CM

## 2018-12-14 DIAGNOSIS — M25561 Pain in right knee: Secondary | ICD-10-CM

## 2018-12-14 NOTE — Therapy (Signed)
[place], [place], [address] Phone: [phone]   Fax:  [phone]  Physical Therapy Treatment  Patient Details  Name: Shannon Khan MRN: [id number] Date of Birth: [birth year] Referring Provider (PT): Elsie Stain, MD   Encounter Date: 12/14/2018  PT End of Session - 12/14/18 1030    Visit Number  6    Number of Visits  17    Date for PT Re-Evaluation  01/14/19    Authorization Type  BCBS    PT Start Time  0930    PT Stop Time  8938    PT Time Calculation (min)  44 min       Past Medical History:  Diagnosis Date  . Hypertension     Past Surgical History:  Procedure Laterality Date  . ORIF TIBIA & FIBULA FRACTURES Left 2012  . THYROIDECTOMY  1990   for goiter    There were no vitals filed for this visit.  Subjective Assessment - 12/14/18 0940    Subjective  No reports of pain today.    Currently in Pain?  No/denies                       [facility] Adult PT Treatment/Exercise - 12/14/18 0001      Knee/Hip Exercises: Stretches   Passive Hamstring Stretch Limitations  seated in chair   verses long sitting   Hip Flexor Stretch Limitations  standing     Other Knee/Hip Stretches  slant board 10 sec x 3       Knee/Hip Exercises: Aerobic   Nustep  L5 Le only x 5 minutes       Knee/Hip Exercises: Standing   Heel Raises  20 reps    Forward Step Up  20 reps    Forward Step Up Limitations  each     Functional Squat Limitations  deep squat at sink 10 x 2     Other Standing Knee Exercises  Tandem gait , stepping over hurdles forward and side ways     Other Standing Knee Exercises  tandem stance, staggered stance       Knee/Hip Exercises: Supine   Other Supine Knee/Hip Exercises  Supine marching with cues for abdominal draw in, SLR x 10 each with abdominal draw in     Other Supine Knee/Hip Exercises  clams with blue band and cues for abdominal draw in and for breathing                    PT Long Term Goals - 12/14/18 1005      PT LONG TERM GOAL #1   Title  5TSTS to 11s    Baseline  17s at eval, 10 sec 12/09/18    Time  8    Period  Weeks    Status  Achieved      PT LONG TERM GOAL #2   Title  BERG to 45/56    Baseline  40/56 at eval    Period  Weeks    Status  Unable to assess      PT LONG TERM GOAL #3   Title  6MWT to improve by [place] (190 ft)    Baseline  850FT  Tested 12/01/18    Time  8    Period  Weeks    Status  Unable to assess      PT LONG TERM GOAL #4   Title  pt will be able  to walk around [employer] without rest break    Baseline  ambulating around [employer] without rest break    Time  8    Period  Weeks    Status  Achieved      PT LONG TERM GOAL #5   Title  pt will be able to ambulate household and short distances without cane    Baseline  no longer using cane    Time  8    Period  Weeks    Status  Achieved            Plan - 12/14/18 1024    Clinical Impression Statement  Shannon Khan arrived with no pain and was instructed in dynamic balance and closed chain LE strengthening. She requires intermittent rest breaks due to fatigue in thighs and muscle pulling in [place]. She required CGA and occassional Min assist to prevent LOB with dynamic balance. Progressed today with supine core strength.    PT Next Visit Plan  continue dynamic balance, check goals, add core to hep    PT Home Exercise Plan  continue home exercises, seated HSS, gastroc stretch, tandem stance, SLS at counter, sit-stand, supine clams with blue band       Patient will benefit from skilled therapeutic intervention in order to improve the following deficits and impairments:  Difficulty walking, Decreased endurance, Decreased activity tolerance, Pain, Improper body mechanics, Impaired flexibility, Decreased balance, Decreased strength  Visit Diagnosis: Muscle weakness (generalized)  Chronic pain of right knee  Other abnormalities of gait  and mobility  Chronic pain of left knee     Problem List Patient Active Problem List   Diagnosis Date Noted  . Class 3 severe obesity due to excess calories with serious comorbidity and body mass index (BMI) of 45.0 to 49.9 in adult (HCC) 11/19/2018  . Numbness and tingling of both feet 11/19/2018  . PAF (paroxysmal atrial fibrillation) (HCC) 11/18/2018  . DM (diabetes mellitus), type 2 (HCC) 10/12/2018  . Hyperglycemia 10/11/2018  . Imbalance 10/11/2018  . On continuous oral anticoagulation 10/11/2018  . Atrial fibrillation with RVR (HCC) 09/17/2018  . Essential hypertension 06/14/2018  . Obesity (BMI 30-39.9) 06/14/2018  . History of thyroidectomy 06/14/2018  . Chronic pain of both knees 06/14/2018    [name], [place] 12/14/2018, 10:32 AM  [facility] [address] [place], [place], [address] Phone: [phone]   Fax:  [phone]  Name: Shannon Khan MRN: [id number] Date of Birth: [birth year]

## 2018-12-15 ENCOUNTER — Encounter: Payer: Self-pay | Admitting: Internal Medicine

## 2018-12-16 ENCOUNTER — Other Ambulatory Visit: Payer: Self-pay

## 2018-12-16 ENCOUNTER — Ambulatory Visit: Payer: BC Managed Care – PPO | Admitting: Physical Therapy

## 2018-12-16 ENCOUNTER — Encounter: Payer: Self-pay | Admitting: Physical Therapy

## 2018-12-16 DIAGNOSIS — M6281 Muscle weakness (generalized): Secondary | ICD-10-CM

## 2018-12-16 DIAGNOSIS — R2689 Other abnormalities of gait and mobility: Secondary | ICD-10-CM | POA: Diagnosis not present

## 2018-12-16 DIAGNOSIS — G8929 Other chronic pain: Secondary | ICD-10-CM

## 2018-12-16 DIAGNOSIS — M25562 Pain in left knee: Secondary | ICD-10-CM

## 2018-12-16 NOTE — Therapy (Signed)
[place], [place], [address] Phone: [phone]   Fax:  [phone]  Physical Therapy Treatment/Discharge  Patient Details  Name: Shannon Khan MRN: [id number] Date of Birth: 03-27-1960 Referring Provider (PT): Elsie Stain, MD   Encounter Date: 12/16/2018  PT End of Session - 12/16/18 1446    Visit Number  7    Number of Visits  17    Date for PT Re-Evaluation  01/14/19    Authorization Type  BCBS    PT Start Time  6962    PT Stop Time  1445    PT Time Calculation (min)  30 min    Activity Tolerance  Patient tolerated treatment well    Behavior During Therapy  [facility] for tasks assessed/performed       Past Medical History:  Diagnosis Date  . Hypertension     Past Surgical History:  Procedure Laterality Date  . ORIF TIBIA & FIBULA FRACTURES Left 2012  . THYROIDECTOMY  1990   for goiter    There were no vitals filed for this visit.  Subjective Assessment - 12/16/18 1430    Subjective  I feel like my balance is getting better.    Patient Stated Goals  walk fast, knees to be free, lose cane    Currently in Pain?  No/denies         [facility] PT Assessment - 12/16/18 0001      Assessment   Medical Diagnosis  imbalance, chronic pain both knees    Referring Provider (PT)  Elsie Stain, MD      Ambulation/Gait   Gait Comments  [id number]      Berg Balance Test   Sit to Stand  Able to stand without using hands and stabilize independently    Standing Unsupported  Able to stand safely 2 minutes    Sitting with Back Unsupported but Feet Supported on Floor or Stool  Able to sit safely and securely 2 minutes    Stand to Sit  Sits safely with minimal use of hands    Transfers  Able to transfer safely, minor use of hands    Standing Unsupported with Eyes Closed  Able to stand 10 seconds safely    Standing Unsupported with Feet Together  Able to place feet together independently and stand 1  minute safely    From Standing, Reach Forward with Outstretched Arm  Can reach forward >12 cm safely (5")    From Standing Position, Pick up Object from Floor  Able to pick up shoe safely and easily    From Standing Position, Turn to Look Behind Over each Shoulder  Looks behind from both sides and weight shifts well    Turn 360 Degrees  Able to turn 360 degrees safely in 4 seconds or less    Standing Unsupported, Alternately Place Feet on Step/Stool  Able to stand independently and complete 8 steps >20 seconds    Standing Unsupported, One Foot in Front  Able to take small step independently and hold 30 seconds    Standing on One Leg  Able to lift leg independently and hold 5-10 seconds    Total Score  51                           PT Education - 12/16/18 1449    Education Details  objective goals & progress, importance of HEP    Person(s) Educated  Patient;Child(ren)    Methods  Explanation    Comprehension  Verbalized understanding          PT Long Term Goals - 12/16/18 1431      PT LONG TERM GOAL #1   Title  5TSTS to 11s    Baseline  17s at eval, 10 sec 12/09/18    Status  Achieved      PT LONG TERM GOAL #2   Title  BERG to 45/56    Baseline  51/56    Status  Achieved      PT LONG TERM GOAL #3   Title  6MWT to improve by [place] (190 ft)    Baseline  improved by 160 ft    Status  Partially Met      PT LONG TERM GOAL #4   Title  pt will be able to walk around [employer] without rest break    Status  Achieved      PT LONG TERM GOAL #5   Title  pt will be able to ambulate household and short distances without cane    Baseline  no longer using cane    Status  Achieved            Plan - 12/16/18 1446    Clinical Impression Statement  Pt has met her goals at this time and is [facility] [place] [place] [facility]. She reports her Rt knee hurts with cold weather and I encouraged her to keep herself warm and keep moving. Encouraged pt to contact us  with any questions.    PT Treatment/Interventions  ADLs/Self Care Home Management;Cryotherapy;Electrical Stimulation;Moist Heat;Gait training;Stair training;Functional mobility training;Therapeutic activities;Therapeutic exercise;Balance training;Patient/family education;Neuromuscular re-education;Manual techniques;Passive range of motion;Taping    PT Home Exercise Plan  continue home exercises, seated HSS, gastroc stretch, tandem stance, SLS at counter, sit-stand, supine clams with blue band    Consulted and Agree with Plan of Care  Patient    Family Member Consulted  Daughter       Patient will benefit from skilled therapeutic intervention in order to improve the following deficits and impairments:  Difficulty walking, Decreased endurance, Decreased activity tolerance, Pain, Improper body mechanics, Impaired flexibility, Decreased balance, Decreased strength  Visit Diagnosis: Muscle weakness (generalized)  Chronic pain of right knee  Other abnormalities of gait and mobility  Chronic pain of left knee     Problem List Patient Active Problem List   Diagnosis Date Noted  . Class 3 severe obesity due to excess calories with serious comorbidity and body mass index (BMI) of 45.0 to 49.9 in adult ([place]) 11/19/2018  . Numbness and tingling of both feet 11/19/2018  . PAF (paroxysmal atrial fibrillation) ([place]) 11/18/2018  . DM (diabetes mellitus), type 2 ([place]) 10/12/2018  . Hyperglycemia 10/11/2018  . Imbalance 10/11/2018  . On continuous oral anticoagulation 10/11/2018  . Atrial fibrillation with RVR ([place]) 09/17/2018  . Essential hypertension 06/14/2018  . Obesity (BMI 30-39.9) 06/14/2018  . History of thyroidectomy 06/14/2018  . Chronic pain of both knees 06/14/2018  PHYSICAL THERAPY DISCHARGE SUMMARY  Visits from Start of Care: 7  Current functional level related to goals / functional outcomes: See above   Remaining deficits: See above   Education / Equipment: Anatomy of  condition, POC, HEP, exercise form/rationale  Plan: Patient agrees to discharge.  Patient goals were met. Patient is being discharged due to meeting the stated rehab goals.  ?????     Azael Ragain C. Sabriel Borromeo PT, DPT 12/16/18 2:50 PM  [place], [place], [address] Phone: [phone]   Fax:  [phone]  Name: Shannon Khan MRN: [id number] Date of Birth: 10-Apr-1960

## 2018-12-21 ENCOUNTER — Ambulatory Visit: Payer: BC Managed Care – PPO | Admitting: Physical Therapy

## 2018-12-22 ENCOUNTER — Ambulatory Visit: Payer: BC Managed Care – PPO | Admitting: Physical Therapy

## 2019-01-03 MED FILL — LOSARTAN POTASSIUM 100 MG T: 100 | 30 days supply | Qty: 30 | Fill #1

## 2019-01-03 MED FILL — TRUE METRIX TEST STRIP: 25 days supply | Qty: 100 | Fill #2

## 2019-01-07 ENCOUNTER — Ambulatory Visit: Payer: PRIVATE HEALTH INSURANCE | Attending: Internal Medicine | Admitting: Internal Medicine

## 2019-01-07 ENCOUNTER — Encounter: Payer: Self-pay | Admitting: Internal Medicine

## 2019-01-07 ENCOUNTER — Other Ambulatory Visit: Payer: Self-pay

## 2019-01-07 VITALS — BP 140/90 | HR 80 | Resp 16 | Wt 225.8 lb

## 2019-01-07 DIAGNOSIS — R319 Hematuria, unspecified: Secondary | ICD-10-CM

## 2019-01-07 DIAGNOSIS — I10 Essential (primary) hypertension: Secondary | ICD-10-CM

## 2019-01-07 DIAGNOSIS — Z124 Encounter for screening for malignant neoplasm of cervix: Secondary | ICD-10-CM | POA: Diagnosis not present

## 2019-01-07 MED ORDER — SPIRONOLACTONE 25 MG PO TABS: 12.5000 mg | ORAL_TABLET | Freq: Every day | ORAL | 3 refills | Status: DC

## 2019-01-07 MED ORDER — METFORMIN HCL 500 MG PO TABS: 500.0000 mg | ORAL_TABLET | Freq: Every day | ORAL | 6 refills | Status: DC

## 2019-01-07 MED ORDER — DILTIAZEM HCL ER COATED BEADS 240 MG PO CP24: 240.0000 mg | ORAL_CAPSULE | Freq: Every day | ORAL | 6 refills | Status: DC

## 2019-01-07 MED ORDER — APIXABAN 5 MG PO TABS: 5.0000 mg | ORAL_TABLET | Freq: Two times a day (BID) | ORAL | 6 refills | Status: DC

## 2019-01-07 MED FILL — metFORMIN HCL 500 MG TABS: 500 | 30 days supply | Qty: 30 | Fill #0

## 2019-01-07 MED FILL — $ELIQUIS 5 MG TABLET: 5 | 30 days supply | Qty: 60 | Fill #0

## 2019-01-07 MED FILL — DILTIAZEM 24HR CD 240 MG CA: 240 | 30 days supply | Qty: 30 | Fill #0

## 2019-01-07 MED FILL — SPIRONOLACTONE 25 MG TABLET: 25 | 30 days supply | Qty: 15 | Fill #0

## 2019-01-07 NOTE — Patient Instructions (Signed)
Your blood pressure is not controlled.  Goal is 130/80 or lower.  We have added a blood pressure medication called spironolactone 25 mg to take half a tablet daily.  Please continue to monitor your blood pressure and let me know some of your readings after you have been on the medication for about a week. Please return to the laboratory after you have been on the spironolactone for 1 week for a chemistry level check.

## 2019-01-07 NOTE — Progress Notes (Signed)
Patient ID: Shannon Khan, female    DOB: 1960-04-21  MRN: [id number]  CC: Gynecologic Exam   Subjective: Shannon Khan is a 58 y.o. female who presents for pap.  Daughter ,[name], is with her and helps interpret what pt does not understand Her concerns today include:  Patient with history of HTN, obesity, excision of goiter, OA knees, A.fib on Eliquis, new DM with microalbumin  [place]. No vaginal bleeding No vaginal dischg or itching.  Request STI screening MMG schedule for next.   No fhx of uterine, breast or ovarian cancer  Hematuria:  Referred to [facility] but they do not take her current insurance.  She will have different insurance come January.   -several UA with microscopy showed blood and WBC in urine.  UA pos for E.coli that was resistant to Cipro but sensitive to Bactrim.  She completed course Bactrim -no further blood in urine since she completed Bactrim for the E.coli -microscopy also revealed Ca-oxalate crystals.  Information given via Mychart on how to prevent these stones from forming  HTN:  elev today.  Pt was upset earlier Checks BP daily.  This a.m was 135/90.  Daughter reports the DBP has been consistently high. She was on HCTZ earlier this yr but it caused significant hypokalemia.  Currently on Cozaar and Diltiazem.  Patient Active Problem List   Diagnosis Date Noted  . Class 3 severe obesity due to excess calories with serious comorbidity and body mass index (BMI) of 45.0 to 49.9 in adult ([place]) 11/19/2018  . Numbness and tingling of both feet 11/19/2018  . PAF (paroxysmal atrial fibrillation) ([place]) 11/18/2018  . DM (diabetes mellitus), type 2 ([place]) 10/12/2018  . Hyperglycemia 10/11/2018  . Imbalance 10/11/2018  . On continuous oral anticoagulation 10/11/2018  . Atrial fibrillation with RVR ([place]) 09/17/2018  . Essential hypertension 06/14/2018  . Obesity (BMI 30-39.9) 06/14/2018  . History of thyroidectomy  06/14/2018  . Chronic pain of both knees 06/14/2018     Current Outpatient Medications on File Prior to Visit  Medication Sig Dispense Refill  . Blood Glucose Monitoring Suppl (TRUE METRIX METER) w/Device KIT Use to measure blood sugar twice a day 1 kit 0  . glucose blood (TRUE METRIX BLOOD GLUCOSE TEST) test strip Use as instructed 100 each 12  . losartan (COZAAR) 100 MG tablet Take 1 tablet (100 mg total) by mouth daily. 30 tablet 11  . TRUEplus Lancets 28G MISC Use to measure blood sugar twice a day 100 each 1   No current facility-administered medications on file prior to visit.    No Known Allergies  Social History   Socioeconomic History  . Marital status: Single    Spouse name: Not on file  . Number of children: 3  . Years of education: completed high school  . Highest education level: Not on file  Occupational History  . Not on file  Tobacco Use  . Smoking status: Never Smoker  . Smokeless tobacco: Never Used  Substance and Sexual Activity  . Alcohol use: Not on file    Comment: occasional wine  . Drug use: Never  . Sexual activity: Not on file  Other Topics Concern  . Not on file  Social History Narrative  . Not on file   Social Determinants of Health   Financial Resource Strain:   . Difficulty of Paying Living Expenses: Not on file  Food Insecurity:   . Worried About Estate manager/land agent  of Food in the Last Year: Not on file  . Ran Out of Food in the Last Year: Not on file  Transportation Needs:   . Lack of Transportation (Medical): Not on file  . Lack of Transportation (Non-Medical): Not on file  Physical Activity:   . Days of Exercise per Week: Not on file  . Minutes of Exercise per Session: Not on file  Stress:   . Feeling of Stress : Not on file  Social Connections:   . Frequency of Communication with Friends and Family: Not on file  . Frequency of Social Gatherings with Friends and Family: Not on file  . Attends Religious Services: Not on file  . Active  Member of Clubs or Organizations: Not on file  . Attends Archivist Meetings: Not on file  . Marital Status: Not on file  Intimate Partner Violence:   . Fear of Current or Ex-Partner: Not on file  . Emotionally Abused: Not on file  . Physically Abused: Not on file  . Sexually Abused: Not on file    Family History  Problem Relation Age of Onset  . Hypertension Mother   . Hypertension Father   . Stroke Father   . Diabetes Paternal Aunt     Past Surgical History:  Procedure Laterality Date  . ORIF TIBIA & FIBULA FRACTURES Left 2012  . THYROIDECTOMY  1990   for goiter    ROS: Review of Systems Negative except as stated above  PHYSICAL EXAM: BP 140/90   Pulse 80   Resp 16   Wt 225 lb 12.8 oz (102.4 kg)   SpO2 96%   BMI 44.10 kg/m   Physical Exam  General appearance - alert, well appearing, older female and in no distress Breasts - breasts appear normal, no suspicious masses, no skin or nipple changes or axillary nodes Pelvic - CMA and daughter present: normal external genitalia, vulva, vagina, cervix, uterus and adnexa   CMP Latest Ref Rng & Units 10/11/2018 09/17/2018 09/17/2018  Glucose 65 - 99 mg/dL 124(H) - 161(H)  BUN 8 - 27 mg/dL 12 - 13  Creatinine 0.57 - 1.00 mg/dL 0.75 - 0.96  Sodium 134 - 144 mmol/L 144 - 140  Potassium 3.5 - 5.2 mmol/L 3.5 3.2(L) 2.6(LL)  Chloride 96 - 106 mmol/L 104 - 104  CO2 20 - 29 mmol/L 26 - 24  Calcium 8.7 - 10.3 mg/dL 9.9 - 9.1  Total Protein 6.0 - 8.5 g/dL 7.4 - 6.8  Total Bilirubin 0.0 - 1.2 mg/dL 0.3 - 0.9  Alkaline Phos 39 - 117 IU/L 103 - 76  AST 0 - 40 IU/L 17 - 26  ALT 0 - 32 IU/L 20 - 26   Lipid Panel     Component Value Date/Time   CHOL 258 (H) 06/14/2018 1538   TRIG 92 06/14/2018 1538   HDL 64 06/14/2018 1538   CHOLHDL 4.0 06/14/2018 1538   LDLCALC 176 (H) 06/14/2018 1538    CBC    Component Value Date/Time   WBC 7.0 10/11/2018 1127   WBC 9.8 09/17/2018 0337   RBC 5.44 (H) 10/11/2018 1127   RBC  4.74 09/17/2018 0337   HGB 15.3 10/11/2018 1127   HCT 45.7 10/11/2018 1127   PLT 193 10/11/2018 1127   MCV 84 10/11/2018 1127   MCH 28.1 10/11/2018 1127   MCH 28.9 09/17/2018 0337   MCHC 33.5 10/11/2018 1127   MCHC 33.8 09/17/2018 0337   RDW 13.1 10/11/2018 1127   LYMPHSABS  3.5 (H) 10/11/2018 1127   MONOABS 0.5 09/17/2018 0337   EOSABS 0.1 10/11/2018 1127   BASOSABS 0.1 10/11/2018 1127    ASSESSMENT AND PLAN: 1. Pap smear for cervical cancer screening - Cytology - PAP([place]) - Cervicovaginal ancillary only  2. Essential hypertension Not at goal Add Spironolactone. Continue to check BP.  Daughter to send me some of her readings after 1 wk.  Return to lab in 1 wk for BMP - spironolactone (ALDACTONE) 25 MG tablet; Take 0.5 tablets (12.5 mg total) by mouth daily.  Dispense: 30 tablet; Refill: 3 - Basic Metabolic Panel; Future  3. Hematuria, unspecified type May have been due to UTI.  Will recheck UA Daughter will let me know once she has new insurance for me to resubmit referral to urology - CBC; Future - Urinalysis, Routine w reflex microscopic; Future   Patient was given the opportunity to ask questions.  Patient verbalized understanding of the plan and was able to repeat key elements of the plan.   Orders Placed This Encounter  Procedures  . Basic Metabolic Panel  . Urinalysis, Routine w reflex microscopic  . CBC     Requested Prescriptions   Signed Prescriptions Disp Refills  . metFORMIN (GLUCOPHAGE) 500 MG tablet 60 tablet 6    Sig: Take 1 tablet (500 mg total) by mouth daily with breakfast.  . diltiazem (CARDIZEM CD) 240 MG 24 hr capsule 30 capsule 6    Sig: Take 1 capsule (240 mg total) by mouth daily.  Marland Kitchen apixaban (ELIQUIS) 5 MG TABS tablet 60 tablet 6    Sig: Take 1 tablet (5 mg total) by mouth 2 (two) times daily.  Marland Kitchen spironolactone (ALDACTONE) 25 MG tablet 30 tablet 3    Sig: Take 0.5 tablets (12.5 mg total) by mouth daily.    Return in about 3  months (around 04/07/2019).  Karle Plumber, MD, FACP

## 2019-01-10 LAB — CERVICOVAGINAL ANCILLARY ONLY
Chlamydia: NEGATIVE
Comment: NEGATIVE
Comment: NEGATIVE
Comment: NORMAL
Neisseria Gonorrhea: NEGATIVE
Trichomonas: NEGATIVE

## 2019-01-10 LAB — CYTOLOGY - PAP
Comment: NEGATIVE
Diagnosis: NEGATIVE
High risk HPV: NEGATIVE

## 2019-01-14 ENCOUNTER — Other Ambulatory Visit: Payer: Self-pay | Admitting: *Deleted

## 2019-01-14 ENCOUNTER — Ambulatory Visit: Payer: PRIVATE HEALTH INSURANCE | Attending: Family Medicine

## 2019-01-14 ENCOUNTER — Encounter: Payer: Self-pay | Admitting: Internal Medicine

## 2019-01-14 ENCOUNTER — Other Ambulatory Visit: Payer: Self-pay

## 2019-01-14 DIAGNOSIS — R319 Hematuria, unspecified: Secondary | ICD-10-CM

## 2019-01-14 DIAGNOSIS — I10 Essential (primary) hypertension: Secondary | ICD-10-CM

## 2019-01-15 LAB — BASIC METABOLIC PANEL
BUN/Creatinine Ratio: 14 (ref 12–28)
BUN: 11 mg/dL (ref 8–27)
CO2: 26 mmol/L (ref 20–29)
Calcium: 10.2 mg/dL (ref 8.7–10.3)
Chloride: 101 mmol/L (ref 96–106)
Creatinine, Ser: 0.81 mg/dL (ref 0.57–1.00)
GFR calc Af Amer: 91 mL/min/{1.73_m2} (ref >59)
GFR calc non Af Amer: 79 mL/min/{1.73_m2} (ref >59)
Glucose: 131 mg/dL — ABNORMAL HIGH (ref 65–99)
Potassium: 4 mmol/L (ref 3.5–5.2)
Sodium: 141 mmol/L (ref 134–144)

## 2019-01-15 LAB — URINALYSIS, ROUTINE W REFLEX MICROSCOPIC
Bilirubin, UA: NEGATIVE
Glucose, UA: NEGATIVE
Ketones, UA: NEGATIVE
Leukocytes,UA: NEGATIVE
Nitrite, UA: NEGATIVE
Protein,UA: NEGATIVE
Specific Gravity, UA: 1.008 (ref 1.005–1.030)
Urobilinogen, Ur: 0.2 mg/dL (ref 0.2–1.0)
pH, UA: 7 (ref 5.0–7.5)

## 2019-01-15 LAB — CBC
Hematocrit: 47 % — ABNORMAL HIGH (ref 34.0–46.6)
Hemoglobin: 15.6 g/dL (ref 11.1–15.9)
MCH: 28.3 pg (ref 26.6–33.0)
MCHC: 33.2 g/dL (ref 31.5–35.7)
MCV: 85 fL (ref 79–97)
Platelets: 235 10*3/uL (ref 150–450)
RBC: 5.51 x10E6/uL — ABNORMAL HIGH (ref 3.77–5.28)
RDW: 13.1 % (ref 11.7–15.4)
WBC: 6.3 10*3/uL (ref 3.4–10.8)

## 2019-01-15 LAB — MICROSCOPIC EXAMINATION
Casts: NONE SEEN /lpf
WBC, UA: NONE SEEN /hpf (ref 0–5)

## 2019-01-16 ENCOUNTER — Other Ambulatory Visit: Payer: Self-pay | Admitting: Internal Medicine

## 2019-01-16 DIAGNOSIS — I10 Essential (primary) hypertension: Secondary | ICD-10-CM

## 2019-01-16 MED ORDER — SPIRONOLACTONE 25 MG PO TABS: 25.0000 mg | ORAL_TABLET | Freq: Every day | ORAL | 3 refills | Status: DC

## 2019-01-20 MED ORDER — CARVEDILOL 3.125 MG PO TABS: 3.1250 mg | ORAL_TABLET | Freq: Two times a day (BID) | ORAL | 3 refills | Status: DC

## 2019-02-01 MED FILL — LOSARTAN POTASSIUM 100 MG T: 100 | 30 days supply | Qty: 30 | Fill #2

## 2019-02-01 MED FILL — SPIRONOLACTONE 25 MG TABLET: 25 | 30 days supply | Qty: 15 | Fill #1

## 2019-02-01 MED FILL — $ELIQUIS 5 MG TABLET: 5 | 30 days supply | Qty: 60 | Fill #1

## 2019-02-01 MED FILL — metFORMIN HCL 500 MG TABS: 500 | 30 days supply | Qty: 30 | Fill #1

## 2019-02-01 MED FILL — DILTIAZEM 24HR CD 240 MG CA: 240 | 30 days supply | Qty: 30 | Fill #1

## 2019-02-02 ENCOUNTER — Other Ambulatory Visit: Payer: Self-pay | Admitting: Pharmacist

## 2019-02-02 DIAGNOSIS — E1142 Type 2 diabetes mellitus with diabetic polyneuropathy: Secondary | ICD-10-CM

## 2019-02-02 MED ORDER — ONETOUCH DELICA LANCETS 33G MISC: 11 refills | Status: AC

## 2019-02-02 MED ORDER — ONETOUCH VERIO REFLECT W/DEVICE KIT: 1.0000 | PACK | Freq: Two times a day (BID) | 0 refills | Status: AC

## 2019-02-02 MED ORDER — ONETOUCH VERIO VI STRP: ORAL_STRIP | 12 refills | Status: AC

## 2019-02-02 NOTE — Progress Notes (Signed)
one

## 2019-02-07 ENCOUNTER — Other Ambulatory Visit: Payer: Self-pay

## 2019-02-07 ENCOUNTER — Ambulatory Visit
Admission: RE | Admit: 2019-02-07 | Discharge: 2019-02-07 | Disposition: A | Discharge status: Home / Self Care | Payer: BC Managed Care – PPO | Source: Ambulatory Visit | Attending: Internal Medicine | Admitting: Internal Medicine

## 2019-02-07 DIAGNOSIS — Z1231 Encounter for screening mammogram for malignant neoplasm of breast: Secondary | ICD-10-CM

## 2019-02-15 MED FILL — SPIRONOLACTONE 25 MG TABLET: 25 | 30 days supply | Qty: 30 | Fill #0

## 2019-03-04 MED FILL — LOSARTAN POTASSIUM 100 MG T: 100 | 30 days supply | Qty: 30 | Fill #3

## 2019-03-04 MED FILL — ELIQUIS 5 MG TABLET: 5 | 30 days supply | Qty: 60 | Fill #2

## 2019-03-11 MED FILL — DILTIAZEM 24HR CD 240 MG CA: 240 | 30 days supply | Qty: 30 | Fill #2

## 2019-03-11 MED FILL — SPIRONOLACTONE 25 MG TABLET: 25 | 30 days supply | Qty: 15 | Fill #2

## 2019-03-11 MED FILL — metFORMIN HCL 500 MG TABS: 500 | 30 days supply | Qty: 30 | Fill #2

## 2019-03-24 ENCOUNTER — Other Ambulatory Visit: Payer: Self-pay

## 2019-03-24 ENCOUNTER — Ambulatory Visit: Payer: PRIVATE HEALTH INSURANCE | Attending: Internal Medicine | Admitting: Internal Medicine

## 2019-03-24 DIAGNOSIS — D6869 Other thrombophilia: Secondary | ICD-10-CM

## 2019-03-24 DIAGNOSIS — R319 Hematuria, unspecified: Secondary | ICD-10-CM

## 2019-03-24 DIAGNOSIS — M170 Bilateral primary osteoarthritis of knee: Secondary | ICD-10-CM

## 2019-03-24 DIAGNOSIS — I10 Essential (primary) hypertension: Secondary | ICD-10-CM | POA: Diagnosis not present

## 2019-03-24 DIAGNOSIS — I480 Paroxysmal atrial fibrillation: Secondary | ICD-10-CM | POA: Diagnosis not present

## 2019-03-24 DIAGNOSIS — E1142 Type 2 diabetes mellitus with diabetic polyneuropathy: Secondary | ICD-10-CM

## 2019-03-24 DIAGNOSIS — Z6841 Body Mass Index (BMI) 40.0 and over, adult: Secondary | ICD-10-CM

## 2019-03-24 MED ORDER — CARVEDILOL 6.25 MG PO TABS: 6.2500 mg | ORAL_TABLET | Freq: Two times a day (BID) | ORAL | 5 refills | Status: DC

## 2019-03-24 MED FILL — CARVEDILOL 6.25 MG TABLET: 6.25 | 30 days supply | Qty: 60 | Fill #0

## 2019-03-24 NOTE — Progress Notes (Signed)
Pt states her blood sugar this morning was 139  

## 2019-03-24 NOTE — Progress Notes (Signed)
Virtual Visit via Telephone Note Due to current restrictions/limitations of in-office visits due to the COVID-19 pandemic, this scheduled clinical appointment was converted to a telehealth visit  I connected with Shannon Khan on 03/24/19 at 12:05 p.m by telephone and verified that I am speaking with the correct person using two identifiers.  I am in my office.  The patient is at home.  Only the patient, her [name] and myself participated in this encounter.  I discussed the limitations, risks, security and privacy concerns of performing an evaluation and management service by telephone and the availability of in person appointments. I also discussed with the patient that there may be a patient responsible charge related to this service. The patient expressed understanding and agreed to proceed.   History of Present Illness: Patient with history of HTN, obesity,excision of goiter, OAknees, A.fib on Eliquis, new DM with microalbumin and neuropathy   Hematuria: On last visit we repeated UA after she had completed Bactrim antibiotics and this came back okay with 0-2 RBC per high-power field which was decreased compared to previous.  She has not had any further hematuria.  Plan was to [facility] once she got no insurance which she is now obtained.  However patient states she does not want to see the urologist anymore since the problem resolved after UA was treated.  HYPERTENSION Currently taking: see medication list Med Adherence: '[x]'$  Yes-she is on Cozaar, diltiazem, spironolactone, carvedilol   '[]'$  No Medication side effects: '[]'$  Yes    '[x]'$  No Adherence with salt restriction: '[x]'$  Yes    '[]'$  No Home Monitoring?: '[x]'$  Yes    '[]'$  No Monitoring Frequency:2 x a day Home BP results range: 121/83. 134/94, 145/101, 134/100, 129/91; pulse 70-80s SOB? '[]'$  Yes    '[x]'$  No Chest Pain?: '[]'$  Yes    '[x]'$  No Leg swelling?: '[]'$  Yes    '[x]'$  No Headaches?: '[]'$  Yes    '[x]'$  No Dizziness? '[]'$  Yes     '[x]'$  No Comments:   A.fib:  No palpitations.  No bruising or bleeding  DIABETES TYPE 2/Obesity Lab Results  Component Value Date   HGBA1C 6.9 (H) 10/11/2018    Med Adherence:  '[x]'$  Yes    '[]'$  No Medication side effects:  '[]'$  Yes    '[x]'$  No Home Monitoring?  '[x]'$  Yes before breakfast    '[]'$  No Home glucose results range: 139, 117, 129, 120 Diet Adherence: '[x]'$  Yes - doing better than before.    Exercise: '[x]'$  Yes - walks up and down in her room. Too cold to get outside    Hypoglycemic episodes?: '[]'$  Yes    '[]'$  No Numbness of the feet? '[]'$  Yes    '[]'$  No Retinopathy hx? '[]'$  Yes    '[]'$  No Last eye exam: no blurred vision. Had eye exam 01/2018 with Dr. Teodoro Spray.  No retinopathy Comments:   C/o pain in the knees RT>LT. Worse with the weather changes.  No falls.  Uses heating pad.   -wgh today is 228 lbs which is up 3 pounds from last visit.  Outpatient Encounter Medications as of 03/24/2019  Medication Sig  . apixaban (ELIQUIS) 5 MG TABS tablet Take 1 tablet (5 mg total) by mouth 2 (two) times daily.  . Blood Glucose Monitoring Suppl (ONETOUCH VERIO REFLECT) w/Device KIT 1 kit by Does not apply route 2 (two) times daily.  . carvedilol (COREG) 3.125 MG tablet Take 1 tablet (3.125 mg total) by mouth 2 (two) times daily  with a meal.  . diltiazem (CARDIZEM CD) 240 MG 24 hr capsule Take 1 capsule (240 mg total) by mouth daily.  Marland Kitchen glucose blood (ONETOUCH VERIO) test strip Use as instructed  . losartan (COZAAR) 100 MG tablet Take 1 tablet (100 mg total) by mouth daily.  . metFORMIN (GLUCOPHAGE) 500 MG tablet Take 1 tablet (500 mg total) by mouth daily with breakfast.  . OneTouch Delica Lancets 20N MISC Use to check blood sugar twice daily.  Marland Kitchen spironolactone (ALDACTONE) 25 MG tablet Take 1 tablet (25 mg total) by mouth daily.   No facility-administered encounter medications on file as of 03/24/2019.   Observations/Objective: Results for orders placed or performed in visit on 01/14/19  Microscopic  Examination  Result Value Ref Range   WBC, UA None seen 0 - 5 /hpf   RBC 0-2 0 - 2 /hpf   Epithelial Cells (non renal) 0-10 0 - 10 /hpf   Casts None seen None seen /lpf   Bacteria, UA Few None seen/Few  Urinalysis, Routine w reflex microscopic  Result Value Ref Range   Specific Gravity, UA 1.008 1.005 - 1.030   pH, UA 7.0 5.0 - 7.5   Color, UA Yellow Yellow   Appearance Ur Clear Clear   Leukocytes,UA Negative Negative   Protein,UA Negative Negative/Trace   Glucose, UA Negative Negative   Ketones, UA Negative Negative   RBC, UA 2+ (A) Negative   Bilirubin, UA Negative Negative   Urobilinogen, Ur 0.2 0.2 - 1.0 mg/dL   Nitrite, UA Negative Negative   Microscopic Examination See below:   CBC  Result Value Ref Range   WBC 6.3 3.4 - 10.8 x10E3/uL   RBC 5.51 (H) 3.77 - 5.28 x10E6/uL   Hemoglobin 15.6 11.1 - 15.9 g/dL   Hematocrit 47.0 (H) 34.0 - 46.6 %   MCV 85 79 - 97 fL   MCH 28.3 26.6 - 33.0 pg   MCHC 33.2 31.5 - 35.7 g/dL   RDW 13.1 11.7 - 15.4 %   Platelets 235 150 - 450 [id number]  Basic Metabolic Panel  Result Value Ref Range   Glucose 131 (H) 65 - 99 mg/dL   BUN 11 8 - 27 mg/dL   Creatinine, Ser 0.81 0.57 - 1.00 mg/dL   GFR calc non Af Amer 79 >59 mL/min/1.73   GFR calc Af Amer 91 >59 mL/min/1.73   BUN/Creatinine Ratio 14 12 - 28   Sodium 141 134 - 144 mmol/L   Potassium 4.0 3.5 - 5.2 mmol/L   Chloride 101 96 - 106 mmol/L   CO2 26 20 - 29 mmol/L   Calcium 10.2 8.7 - 10.3 mg/dL     Assessment and Plan: 1. Type 2 diabetes mellitus with diabetic polyneuropathy, without long-term current use of insulin ([place]) Reported blood sugar readings are at goal.  Patient to continue Metformin 500 mg daily.  Encouraged her to continue to try to eat healthy. Encouraged her to try to get out and walk at least 3 days a week for 30 minutes now that the weather is warming up  2. Essential hypertension Not at goal.  Pulse rate would allow for [place] to increase the carvedilol to 6.25 mg  twice a day.  Continue other medications and continue to monitor blood pressure - carvedilol (COREG) 6.25 MG tablet; Take 1 tablet (6.25 mg total) by mouth 2 (two) times daily with a meal.  Dispense: 60 tablet; Refill: 5  3. PAF (paroxysmal atrial fibrillation) ([place]) 4. Acquired thrombophilia ([place]) Acquired  thrombophilia due to PAF.  She is on Eliquis  5. Class 3 severe obesity due to excess calories with serious comorbidity and body mass index (BMI) of 45.0 to 49.9 in adult [facility]) See #1 above  6. Primary osteoarthritis of both knees Discussed the importance of weight loss to help decrease mechanical strain on the knees which will help prevent progression of osteoarthritis.  Recommend use of Tylenol as needed  7. Hematuria, unspecified type Resolved with treatment of UTI.  Patient not wanting to see urologist.  I think it is reasonable to observe and if she has any further episodes we can refer.   Follow Up Instructions: 3 to 4 months   I discussed the assessment and treatment plan with the patient. The patient was provided an opportunity to ask questions and all were answered. The patient agreed with the plan and demonstrated an understanding of the instructions.   The patient was advised to call back or seek an in-person evaluation if the symptoms worsen or if the condition fails to improve as anticipated.  I provided 21 minutes of non-face-to-face time during this encounter.   Karle Plumber, MD

## 2019-03-28 MED FILL — SPIRONOLACTONE 25 MG TABLET: 25 | 30 days supply | Qty: 15 | Fill #3

## 2019-03-28 MED FILL — LOSARTAN POTASSIUM 100 MG T: 100 | 30 days supply | Qty: 30 | Fill #4

## 2019-04-14 MED FILL — ELIQUIS 5 MG TABLET: 5 | 30 days supply | Qty: 60 | Fill #3

## 2019-04-14 MED FILL — SPIRONOLACTONE 25 MG TABLET: 25 | 30 days supply | Qty: 30 | Fill #1

## 2019-04-14 MED FILL — DILTIAZEM 24HR CD 240 MG CA: 240 | 30 days supply | Qty: 30 | Fill #3

## 2019-04-14 MED FILL — metFORMIN HCL 500 MG TABS: 500 | 30 days supply | Qty: 30 | Fill #3

## 2019-04-28 MED FILL — SPIRONOLACTONE 25 MG TABLET: 25 | 30 days supply | Qty: 15 | Fill #4

## 2019-04-28 MED FILL — CARVEDILOL 6.25 MG TABLET: 6.25 | 30 days supply | Qty: 60 | Fill #1

## 2019-04-28 MED FILL — LOSARTAN POTASSIUM 100 MG T: 100 | 30 days supply | Qty: 30 | Fill #5

## 2019-05-12 MED FILL — DILTIAZEM 24HR ER 240 MG CA: 240 | 30 days supply | Qty: 30 | Fill #4

## 2019-05-12 MED FILL — SPIRONOLACTONE 25 MG TABLET: 25 | 30 days supply | Qty: 30 | Fill #2

## 2019-05-12 MED FILL — ELIQUIS 5 MG TABLET: 5 | 30 days supply | Qty: 60 | Fill #4

## 2019-05-12 MED FILL — METFORMIN HCL 500 MG TABS: 500 | 30 days supply | Qty: 30 | Fill #4

## 2019-05-26 MED FILL — CARVEDILOL 6.25 MG TABLET: 6.25 | 30 days supply | Qty: 60 | Fill #2

## 2019-05-26 MED FILL — LOSARTAN POTASSIUM 100 MG T: 100 | 30 days supply | Qty: 30 | Fill #6

## 2019-06-21 MED FILL — SPIRONOLACTONE 25 MG TABLET: 25 | 30 days supply | Qty: 30 | Fill #3

## 2019-06-21 MED FILL — LOSARTAN POTASSIUM 100 MG T: 100 | 30 days supply | Qty: 30 | Fill #7

## 2019-06-21 MED FILL — CARVEDILOL 6.25 MG TABLET: 6.25 | 30 days supply | Qty: 60 | Fill #3

## 2019-07-12 MED FILL — DILTIAZEM 24HR ER 240 MG CA: 240 | 30 days supply | Qty: 30 | Fill #6

## 2019-07-12 MED FILL — ELIQUIS 5 MG TABLET: 5 | 30 days supply | Qty: 60 | Fill #6

## 2019-07-12 MED FILL — METFORMIN HCL 500 MG TABS: 500 | 30 days supply | Qty: 30 | Fill #6

## 2019-07-22 MED FILL — SPIRONOLACTONE 25 MG TABLET: 25 | 30 days supply | Qty: 15 | Fill #5

## 2019-07-22 MED FILL — AMOXICILLIN 500 MG CAPSULE: 500 | 7 days supply | Qty: 21 | Fill #0

## 2019-07-22 MED FILL — ACETAMINOPHEN/COD #3 TABLET: 300-30 | 2 days supply | Qty: 12 | Fill #0

## 2019-07-22 MED FILL — IBUPROFEN 800 MG TABLET: 800 | 6 days supply | Qty: 24 | Fill #0

## 2019-07-22 MED FILL — CARVEDILOL 6.25 MG TABLET: 6.25 | 30 days supply | Qty: 60 | Fill #4

## 2019-07-22 MED FILL — LOSARTAN POTASSIUM 100 MG T: 100 | 30 days supply | Qty: 30 | Fill #8

## 2019-07-29 ENCOUNTER — Ambulatory Visit: Payer: PRIVATE HEALTH INSURANCE | Admitting: Internal Medicine

## 2019-08-09 ENCOUNTER — Other Ambulatory Visit: Payer: Self-pay | Admitting: Internal Medicine

## 2019-08-09 MED FILL — METFORMIN HCL 500 MG TABS: 500 | 30 days supply | Qty: 30 | Fill #7

## 2019-08-09 MED FILL — ELIQUIS 5 MG TABLET: 5 | 30 days supply | Qty: 60 | Fill #0

## 2019-08-09 MED FILL — DILTIAZEM 24HR ER 240 MG CA: 240 | 30 days supply | Qty: 30 | Fill #0

## 2019-08-09 NOTE — Telephone Encounter (Signed)
Requested Prescriptions  Pending Prescriptions Disp Refills  . diltiazem (CARDIZEM CD) 240 MG 24 hr capsule [Pharmacy Med Name: DILTIAZEM 24HR ER 240 MG CA 240 Capsule] 30 capsule 0    Sig: TAKE 1 CAPSULE (240 MG TOTAL) BY MOUTH DAILY.     Cardiovascular:  Calcium Channel Blockers Failed - 08/09/2019  1:46 PM      Failed - Last BP in normal range    BP Readings from Last 1 Encounters:  01/07/19 140/90         Failed - Valid encounter within last 6 months    Recent Outpatient Visits          4 months ago Type 2 diabetes mellitus with diabetic polyneuropathy, without long-term current use of insulin (HCC)   [place] [facility] Jonah Blue B, MD   7 months ago Pap smear for cervical cancer screening   [facility] [facility] Jonah Blue B, MD   8 months ago Type 2 diabetes mellitus with diabetic polyneuropathy, without long-term current use of insulin [facility])   [place] [facility] Marcine Matar, MD   10 months ago Essential hypertension   [facility] Storm Frisk, MD   1 year ago Essential hypertension   [facility] Marcine Matar, MD      Future Appointments            In 1 week Marcine Matar, MD [facility]           . ELIQUIS 5 MG TABS tablet [Pharmacy Med Name: ELIQUIS 5 MG TABLET 5 Tablet] 60 tablet 0    Sig: TAKE 1 TABLET (5 MG TOTAL) BY MOUTH 2 (TWO) TIMES DAILY.     Hematology:  Anticoagulants Failed - 08/09/2019  1:46 PM      Failed - HCT in normal range and within 360 days    Hematocrit  Date Value Ref Range Status  01/14/2019 47.0 (H) 34.0 - 46.6 % Final         Passed - HGB in normal range and within 360 days    Hemoglobin  Date Value Ref Range Status  01/14/2019 15.6 11.1 - 15.9 g/dL Final         Passed - PLT in normal range and within 360 days    Platelets  Date Value Ref Range  Status  01/14/2019 235 150 - 450 x10E3/uL Final         Passed - Cr in normal range and within 360 days    Creatinine, Ser  Date Value Ref Range Status  01/14/2019 0.81 0.57 - 1.00 mg/dL Final         Passed - Valid encounter within last 12 months    Recent Outpatient Visits          4 months ago Type 2 diabetes mellitus with diabetic polyneuropathy, without long-term current use of insulin (HCC)   [facility] Jonah Blue B, MD   7 months ago Pap smear for cervical cancer screening   [place] [facility] Jonah Blue B, MD   8 months ago Type 2 diabetes mellitus with diabetic polyneuropathy, without long-term current use of insulin [facility])    [facility] [facility] Marcine Matar, MD   10 months ago Essential hypertension   [facility] Storm Frisk, MD  1 year ago Essential hypertension   [place] [facility] Marcine Matar, MD      Future Appointments            In 1 week Laural Benes Binnie Rail, MD [facility]

## 2019-08-10 ENCOUNTER — Other Ambulatory Visit: Payer: Self-pay | Admitting: Pharmacist

## 2019-08-10 ENCOUNTER — Other Ambulatory Visit: Payer: Self-pay | Admitting: Internal Medicine

## 2019-08-10 DIAGNOSIS — I10 Essential (primary) hypertension: Secondary | ICD-10-CM

## 2019-08-10 MED ORDER — SPIRONOLACTONE 25 MG PO TABS: 25.0000 mg | ORAL_TABLET | Freq: Every day | ORAL | 0 refills | Status: DC

## 2019-08-10 MED FILL — SPIRONOLACTONE 25 MG TABLET: 25 | 30 days supply | Qty: 30 | Fill #0

## 2019-08-18 ENCOUNTER — Ambulatory Visit: Payer: BC Managed Care – PPO | Attending: Internal Medicine | Admitting: Internal Medicine

## 2019-08-18 ENCOUNTER — Other Ambulatory Visit: Payer: Self-pay

## 2019-08-18 DIAGNOSIS — I480 Paroxysmal atrial fibrillation: Secondary | ICD-10-CM | POA: Diagnosis not present

## 2019-08-18 DIAGNOSIS — Z1211 Encounter for screening for malignant neoplasm of colon: Secondary | ICD-10-CM | POA: Diagnosis not present

## 2019-08-18 DIAGNOSIS — I10 Essential (primary) hypertension: Secondary | ICD-10-CM | POA: Diagnosis not present

## 2019-08-18 DIAGNOSIS — E1142 Type 2 diabetes mellitus with diabetic polyneuropathy: Secondary | ICD-10-CM

## 2019-08-18 NOTE — Progress Notes (Signed)
Virtual Visit via Telephone Note Due to current restrictions/limitations of in-office visits due to the COVID-19 pandemic, this scheduled clinical appointment was converted to a telehealth visit  I connected with Shannon Khan on 08/18/19 at 12:11 P.M by telephone and verified that I am speaking with the correct person using two identifiers. I am in my office.  The patient is at home.  Only the patient, her daughter Shannon Khan and myself participated in this encounter.  I discussed the limitations, risks, security and privacy concerns of performing an evaluation and management service by telephone and the availability of in person appointments. I also discussed with the patient that there may be a patient responsible charge related to this service. The patient expressed understanding and agreed to proceed.   History of Present Illness: Patient with history of HTN, obesity,excision of goiter, OAknees, A.fibon Eliquis, new DMwith microalbumin and neuropathy  HYPERTENSION/A.fib Currently taking: see medication list Med Adherence: '[x]'$  Yes    '[]'$  No Medication side effects: '[]'$  Yes    '[x]'$  No Adherence with salt restriction: '[x]'$  Yes    '[]'$  No Home Monitoring?: '[x]'$  Yes    '[]'$  No Monitoring Frequency: daily Home BP results range:  120-130/80-90 SOB? '[]'$  Yes    '[x]'$  No Chest Pain?: '[]'$  Yes    '[x]'$  No Leg swelling?: '[]'$  Yes    '[x]'$  No Headaches?: '[]'$  Yes    '[x]'$  No Dizziness? '[]'$  Yes    '[x]'$  No Comments:  No palpitations.  No bruising or bleeding on Eliquis  DIABETES TYPE 2 Last A1C:   Lab Results  Component Value Date   HGBA1C 6.9 (H) 10/11/2018  Med Adherence:  '[x]'$  Yes    '[]'$  No Medication side effects:  '[]'$  Yes    '[x]'$  No Home Monitoring?  '[x]'$  Yes  Every morning before BF.  This a.m was 131 Home glucose results range: 100-120s Diet Adherence: '[x]'$  Yes  - smaller portions Exercise:  Does stretching exercises in bed.  She does walk at times Hypoglycemic episodes?: '[]'$  Yes    '[x]'$  No Numbness of the  feet? '[]'$  Yes    '[]'$  No Retinopathy hx? '[]'$  Yes    '[]'$  No Last eye exam:  Comments:  HM: Completed COVID-19 vaccine but not sure the dates   Outpatient Encounter Medications as of 08/18/2019  Medication Sig  . Blood Glucose Monitoring Suppl (ONETOUCH VERIO REFLECT) w/Device KIT 1 kit by Does not apply route 2 (two) times daily.  . carvedilol (COREG) 6.25 MG tablet Take 1 tablet (6.25 mg total) by mouth 2 (two) times daily with a meal.  . diltiazem (CARDIZEM CD) 240 MG 24 hr capsule TAKE 1 CAPSULE (240 MG TOTAL) BY MOUTH DAILY.  Marland Kitchen ELIQUIS 5 MG TABS tablet TAKE 1 TABLET (5 MG TOTAL) BY MOUTH 2 (TWO) TIMES DAILY.  Marland Kitchen glucose blood (ONETOUCH VERIO) test strip Use as instructed  . losartan (COZAAR) 100 MG tablet Take 1 tablet (100 mg total) by mouth daily.  . metFORMIN (GLUCOPHAGE) 500 MG tablet Take 1 tablet (500 mg total) by mouth daily with breakfast.  . OneTouch Delica Lancets 01U MISC Use to check blood sugar twice daily.  Marland Kitchen spironolactone (ALDACTONE) 25 MG tablet Take 1 tablet (25 mg total) by mouth daily.   No facility-administered encounter medications on file as of 08/18/2019.      Observations/Objective: Results for orders placed or performed in visit on 01/14/19  Microscopic Examination  Result Value Ref Range   WBC, UA None seen 0 - 5 /hpf  RBC 0-2 0 - 2 /hpf   Epithelial Cells (non renal) 0-10 0 - 10 /hpf   Casts None seen None seen /lpf   Bacteria, UA Few None seen/Few  Urinalysis, Routine w reflex microscopic  Result Value Ref Range   Specific Gravity, UA 1.008 1.005 - 1.030   pH, UA 7.0 5.0 - 7.5   Color, UA Yellow Yellow   Appearance Ur Clear Clear   Leukocytes,UA Negative Negative   Protein,UA Negative Negative/Trace   Glucose, UA Negative Negative   Ketones, UA Negative Negative   RBC, UA 2+ (A) Negative   Bilirubin, UA Negative Negative   Urobilinogen, Ur 0.2 0.2 - 1.0 mg/dL   Nitrite, UA Negative Negative   Microscopic Examination See below:   CBC  Result  Value Ref Range   WBC 6.3 3.4 - 10.8 x10E3/uL   RBC 5.51 (H) 3.77 - 5.28 x10E6/uL   Hemoglobin 15.6 11.1 - 15.9 g/dL   Hematocrit 47.0 (H) 34.0 - 46.6 %   MCV 85 79 - 97 fL   MCH 28.3 26.6 - 33.0 pg   MCHC 33.2 31 - 35 g/dL   RDW 13.1 11.7 - 15.4 %   Platelets 235 150 - 450 [id number]/BT  Basic Metabolic Panel  Result Value Ref Range   Glucose 131 (H) 65 - 99 mg/dL   BUN 11 8 - 27 mg/dL   Creatinine, Ser 0.81 0.57 - 1.00 mg/dL   GFR calc non Af Amer 79 >59 mL/min/1.73   GFR calc Af Amer 91 >59 mL/min/1.73   BUN/Creatinine Ratio 14 12 - 28   Sodium 141 134 - 144 mmol/L   Potassium 4.0 3.5 - 5.2 mmol/L   Chloride 101 96 - 106 mmol/L   CO2 26 20 - 29 mmol/L   Calcium 10.2 8.7 - 10.3 mg/dL    Assessment and Plan: 1. Type 2 diabetes mellitus with diabetic polyneuropathy, without long-term current use of insulin (HCC) Blood sugars are at goal. Continue Metformin, healthy eating habits and regular exercise. - Hemoglobin A1c; Future - Lipid panel; Future - Hepatic Function Panel; Future  2. Essential hypertension Diastolic blood pressure running a little above what we would like it to be but will continue her current medications which include carvedilol, spironolactone, Cozaar, diltiazem  3. PAF (paroxysmal atrial fibrillation) (HCC) Stable.  Continue Eliquis and diltiazem  4. Screening for colon cancer Discussed colon cancer screening.  She had a fit test over 1 year ago.  We discussed methods for colon cancer screening.  Patient agreeable to doing Cologuard - Cologuard   Follow Up Instructions: 4 months in person   I discussed the assessment and treatment plan with the patient. The patient was provided an opportunity to ask questions and all were answered. The patient agreed with the plan and demonstrated an understanding of the instructions.   The patient was advised to call back or seek an in-person evaluation if the symptoms worsen or if the condition fails to improve as  anticipated.  I provided 14 minutes of non-face-to-face time during this encounter.   Karle Plumber, MD

## 2019-08-23 ENCOUNTER — Ambulatory Visit: Payer: PRIVATE HEALTH INSURANCE | Attending: Internal Medicine

## 2019-08-23 ENCOUNTER — Other Ambulatory Visit: Payer: Self-pay

## 2019-08-23 DIAGNOSIS — E1142 Type 2 diabetes mellitus with diabetic polyneuropathy: Secondary | ICD-10-CM

## 2019-08-23 DIAGNOSIS — I10 Essential (primary) hypertension: Secondary | ICD-10-CM

## 2019-08-23 MED FILL — CARVEDILOL 6.25 MG TABLET: 6.25 | 30 days supply | Qty: 60 | Fill #5

## 2019-08-23 MED FILL — LOSARTAN POTASSIUM 100 MG T: 100 | 30 days supply | Qty: 30 | Fill #9

## 2019-08-24 ENCOUNTER — Other Ambulatory Visit: Payer: Self-pay | Admitting: Internal Medicine

## 2019-08-24 DIAGNOSIS — E1169 Type 2 diabetes mellitus with other specified complication: Secondary | ICD-10-CM | POA: Insufficient documentation

## 2019-08-24 LAB — HEPATIC FUNCTION PANEL
ALT: 18 IU/L (ref 0–32)
AST: 18 IU/L (ref 0–40)
Albumin: 4.4 g/dL (ref 3.8–4.8)
Alkaline Phosphatase: 120 IU/L (ref 48–121)
Bilirubin Total: <0.2 mg/dL (ref 0.0–1.2)
Bilirubin, Direct: 0.07 mg/dL (ref 0.00–0.40)
Total Protein: 7.5 g/dL (ref 6.0–8.5)

## 2019-08-24 LAB — LIPID PANEL
Chol/HDL Ratio: 3.9 ratio (ref 0.0–4.4)
Cholesterol, Total: 218 mg/dL — ABNORMAL HIGH (ref 100–199)
HDL: 56 mg/dL (ref >39)
LDL Chol Calc (NIH): 149 mg/dL — ABNORMAL HIGH (ref 0–99)
Triglycerides: 74 mg/dL (ref 0–149)
VLDL Cholesterol Cal: 13 mg/dL (ref 5–40)

## 2019-08-24 LAB — HEMOGLOBIN A1C
Est. average glucose Bld gHb Est-mCnc: 140 mg/dL
Hgb A1c MFr Bld: 6.5 % — ABNORMAL HIGH (ref 4.8–5.6)

## 2019-08-24 LAB — POTASSIUM: Potassium: 4.8 mmol/L (ref 3.5–5.2)

## 2019-08-24 MED ORDER — ATORVASTATIN CALCIUM 10 MG PO TABS: 10.0000 mg | ORAL_TABLET | Freq: Every day | ORAL | 3 refills | Status: DC

## 2019-08-24 MED FILL — ATORVASTATIN 10 MG TABLET: 10 | 30 days supply | Qty: 30 | Fill #0

## 2019-09-09 ENCOUNTER — Other Ambulatory Visit: Payer: Self-pay | Admitting: Internal Medicine

## 2019-09-09 DIAGNOSIS — I10 Essential (primary) hypertension: Secondary | ICD-10-CM

## 2019-09-09 MED ORDER — METFORMIN HCL 500 MG PO TABS: 500.0000 mg | ORAL_TABLET | Freq: Every day | ORAL | 6 refills | Status: DC

## 2019-09-09 MED FILL — ELIQUIS 5 MG TABLET: 5 | 30 days supply | Qty: 60 | Fill #0

## 2019-09-09 MED FILL — SPIRONOLACTONE 25 MG TABLET: 25 | 30 days supply | Qty: 30 | Fill #0

## 2019-09-09 MED FILL — DILTIAZEM 24HR ER 240 MG CA: 240 | 30 days supply | Qty: 30 | Fill #0

## 2019-09-09 MED FILL — METFORMIN HCL 500 MG TABS: 500 | 30 days supply | Qty: 30 | Fill #8

## 2019-09-09 NOTE — Telephone Encounter (Signed)
Copied from CRM [id number]. Topic: Quick Communication - Rx Refill/Question >> Sep 09, 2019  1:10 PM Mcneil, Ja-Kwan wrote: Medication: spironolactone (ALDACTONE) 25 MG tablet, diltiazem (CARDIZEM CD) 240 MG 24 hr capsule, metFORMIN (GLUCOPHAGE) 500 MG tablet, and ELIQUIS 5 MG TABS tablet  Has the patient contacted their pharmacy? no  Preferred Pharmacy (with phone number or street name): [facility] [place], [place] [address] Phone: [phone]   Fax: [phone]  Agent: Please be advised that RX refills may take up to 3 business days. We ask that you follow-up with your pharmacy.

## 2019-09-11 ENCOUNTER — Encounter: Payer: Self-pay | Admitting: Internal Medicine

## 2019-09-22 ENCOUNTER — Other Ambulatory Visit: Payer: Self-pay | Admitting: Internal Medicine

## 2019-09-22 DIAGNOSIS — I10 Essential (primary) hypertension: Secondary | ICD-10-CM

## 2019-09-22 MED FILL — CARVEDILOL 6.25 MG TABLET: 6.25 | 30 days supply | Qty: 60 | Fill #0

## 2019-09-28 MED FILL — ATORVASTATIN 10 MG TABLET: 10 | 30 days supply | Qty: 30 | Fill #1

## 2019-09-28 MED FILL — LOSARTAN POTASSIUM 100 MG T: 100 | 30 days supply | Qty: 30 | Fill #10

## 2019-09-30 NOTE — Telephone Encounter (Signed)
Scheduled appointment.  Daughter aware   Verbalized understanding

## 2019-10-04 ENCOUNTER — Other Ambulatory Visit: Payer: Self-pay | Admitting: Internal Medicine

## 2019-10-04 DIAGNOSIS — I10 Essential (primary) hypertension: Secondary | ICD-10-CM

## 2019-10-04 MED FILL — $ELIQUIS 5 MG TABLET: 5 | 90 days supply | Qty: 180 | Fill #0

## 2019-10-04 MED FILL — METFORMIN HCL 500 MG TABS: 500 | 30 days supply | Qty: 30 | Fill #9

## 2019-10-04 MED FILL — SPIRONOLACTONE 25 MG TABLET: 25 | 30 days supply | Qty: 30 | Fill #0

## 2019-10-04 MED FILL — DILTIAZEM HCL ER COATED BEA: 240 | 30 days supply | Qty: 30 | Fill #0

## 2019-10-24 MED FILL — ATORVASTATIN 10 MG TABLET: 10 | 30 days supply | Qty: 30 | Fill #2

## 2019-10-24 MED FILL — CARVEDILOL 6.25 MG TABLET: 6.25 | 30 days supply | Qty: 60 | Fill #1

## 2019-10-24 MED FILL — LOSARTAN POTASSIUM 100 MG T: 100 | 30 days supply | Qty: 30 | Fill #11

## 2019-11-09 MED FILL — SPIRONOLACTONE 25 MG TABLET: 25 | 30 days supply | Qty: 30 | Fill #1

## 2019-11-09 MED FILL — METFORMIN HCL 500 MG TABS: 500 | 30 days supply | Qty: 30 | Fill #10

## 2019-11-09 MED FILL — DILTIAZEM HCL ER COATED BEA: 240 | 30 days supply | Qty: 30 | Fill #1

## 2019-11-24 ENCOUNTER — Other Ambulatory Visit: Payer: Self-pay | Admitting: Internal Medicine

## 2019-11-24 DIAGNOSIS — I10 Essential (primary) hypertension: Secondary | ICD-10-CM

## 2019-11-24 MED FILL — CARVEDILOL 6.25 MG TABLET: 6.25 | 30 days supply | Qty: 60 | Fill #2

## 2019-11-24 MED FILL — LOSARTAN POTASSIUM 100 MG T: 100 | 30 days supply | Qty: 30 | Fill #0

## 2019-11-24 MED FILL — ATORVASTATIN 10 MG TABLET: 10 | 30 days supply | Qty: 30 | Fill #3

## 2019-12-01 NOTE — Progress Notes (Signed)
Cardiology Office Note   Date:  12/02/2019   ID:  Shannon, Khan October 15, 1960, MRN [id number]  PCP:  Shannon Pier, MD  Cardiologist:   Shannon Breeding, MD   Chief Complaint  Patient presents with  . Joint Pain      History of Present Illness: Shannon Khan is a 59 y.o. female who is referred by Dr. Joya Khan for evaluation of atrial fib.  She was seen in the hospital by Dr. Stanford Khan.  She had atrial fib and was rate controlled and anticoagulated.  The plan was for possible cardioversion.  However, she spontaneously converted to NSR.      Since I saw her last year she has had no further tachyarrhythmias.  She was symptomatic when she had this and she thinks that she would be able to know when she was in this rhythm.  In addition she checks her blood pressure with heart rate at least once a day and she is never found her heart rate to be other than the 60s.  She denies any palpitations, presyncope or syncope.  She had no chest pressure, neck or arm discomfort.  She said no weight gain or edema.  She does a little walking in her home and does some stretching exercises and worked with physical therapy.  Past Medical History:  Diagnosis Date  . Hypertension     Past Surgical History:  Procedure Laterality Date  . ORIF TIBIA & FIBULA FRACTURES Left 2012  . THYROIDECTOMY  1990   for goiter     Current Outpatient Medications  Medication Sig Dispense Refill  . atorvastatin (LIPITOR) 10 MG tablet Take 1 tablet (10 mg total) by mouth daily. 90 tablet 3  . Blood Glucose Monitoring Suppl (ONETOUCH VERIO REFLECT) w/Device KIT 1 kit by Does not apply route 2 (two) times daily. 1 kit 0  . carvedilol (COREG) 6.25 MG tablet TAKE 1 TABLET (6.25 MG TOTAL) BY MOUTH 2 (TWO) TIMES DAILY WITH A MEAL. 60 tablet 5  . diltiazem (CARDIZEM CD) 240 MG 24 hr capsule TAKE 1 CAPSULE (240 MG TOTAL) BY MOUTH DAILY. 30 capsule 5  . glucose blood (ONETOUCH VERIO) test strip Use as instructed 100 each 12   . losartan (COZAAR) 100 MG tablet TAKE 1 TABLET (100 MG TOTAL) BY MOUTH DAILY. 30 tablet 2  . metFORMIN (GLUCOPHAGE) 500 MG tablet Take 1 tablet (500 mg total) by mouth daily with breakfast. 60 tablet 6  . OneTouch Delica Lancets 81R MISC Use to check blood sugar twice daily. 100 each 11  . spironolactone (ALDACTONE) 25 MG tablet TAKE 1 TABLET (25 MG TOTAL) BY MOUTH DAILY. 30 tablet 5  . aspirin EC 81 MG tablet Take 1 tablet (81 mg total) by mouth daily. Swallow whole. 90 each 3   No current facility-administered medications for this visit.    Allergies:   Patient has no known allergies.    ROS:  Please see the history of present illness.   Otherwise, review of systems are positive for none.   All other systems are reviewed and negative.    PHYSICAL EXAM: VS:  BP (!) 155/93   Pulse 68   Ht 5' 2" (1.575 m)   Wt 226 lb (102.5 kg)   SpO2 98%   BMI 41.34 kg/m  , BMI Body mass index is 41.34 kg/m. GENERAL:  Well appearing NECK:  No jugular venous distention, waveform within normal limits, carotid upstroke brisk and symmetric, no bruits, no thyromegaly LUNGS:  Clear to auscultation bilaterally CHEST:  Unremarkable HEART:  PMI not displaced or sustained,S1 and S2 within normal limits, no S3, no S4, no clicks, no rubs, no murmurs ABD:  Flat, positive bowel sounds normal in frequency in pitch, no bruits, no rebound, no guarding, no midline pulsatile mass, no hepatomegaly, no splenomegaly EXT:  2 plus pulses throughout, no edema, no cyanosis no clubbing   EKG:  EKG is  ordered today. Normal sinus rhythm, rate 68, axis within normal limits, intervals within normal limits, poor anterior R wave progression, no acute ST-T wave changes.    Recent Labs: 01/14/2019: BUN 11; Creatinine, Ser 0.81; Hemoglobin 15.6; Platelets 235; Sodium 141 08/23/2019: ALT 18; Potassium 4.8    Lipid Panel    Component Value Date/Time   CHOL 218 (H) 08/23/2019 0832   TRIG 74 08/23/2019 0832   HDL 56  08/23/2019 0832   CHOLHDL 3.9 08/23/2019 0832   LDLCALC 149 (H) 08/23/2019 0832      Wt Readings from Last 3 Encounters:  12/02/19 226 lb (102.5 kg)  01/07/19 225 lb 12.8 oz (102.4 kg)  11/19/18 230 lb 12.8 oz (104.7 kg)      Other studies Reviewed: Additional studies/ records that were reviewed today include:  None Review of the above records demonstrates:  Please see elsewhere in the note.     ASSESSMENT AND PLAN:  PAF:   [name] has a CHA2DS2 - VASc score of 2.   I had a long discussion with the patient and her daughter who is an ICU nurse.   At this point there is been absolutely no recurrence or evidence of recurrence of the fibrillation so I think it is prudent to discontinue the Eliquis.  I do calculate that she has a Framingham risk score of 21.5 and so I think she should be on aspirin.  They will continue to survey for evidence of recurrent fibrillation at which point we would reconsider the use of DOAC.   HTN:   Blood pressure diastolic is elevated but at home it is in the 110s 120s systolic with normal diastolics.  No change in therapy.   DYSLIPIDEMIA: I agree with statin with a goal LDL at least less than 100.  Current medicines are reviewed at length with the patient today.  The patient does not have concerns regarding medicines.  The following changes have been made:   As above  Labs/ tests ordered today include:   None  Orders Placed This Encounter  Procedures  . EKG 12-Lead     Disposition:   FU with me as needed.    Signed, Shannon Hochrein, MD  12/02/2019 12:49 PM    [facility]   

## 2019-12-02 ENCOUNTER — Other Ambulatory Visit: Payer: Self-pay

## 2019-12-02 ENCOUNTER — Encounter: Payer: Self-pay | Admitting: Cardiology

## 2019-12-02 ENCOUNTER — Ambulatory Visit (INDEPENDENT_AMBULATORY_CARE_PROVIDER_SITE_OTHER): Payer: PRIVATE HEALTH INSURANCE | Admitting: Cardiology

## 2019-12-02 VITALS — BP 155/93 | HR 68 | Ht 62.0 in | Wt 226.0 lb

## 2019-12-02 DIAGNOSIS — I480 Paroxysmal atrial fibrillation: Secondary | ICD-10-CM

## 2019-12-02 DIAGNOSIS — I10 Essential (primary) hypertension: Secondary | ICD-10-CM

## 2019-12-02 MED ORDER — ASPIRIN EC 81 MG PO TBEC: 81.0000 mg | DELAYED_RELEASE_TABLET | Freq: Every day | ORAL | 3 refills | Status: AC

## 2019-12-02 NOTE — Patient Instructions (Signed)
Medication Instructions:  STOP taking Eliquis START taking Aspirin 81 mg once a day *If you need a refill on your cardiac medications before your next appointment, please call your pharmacy*   Lab Work: None ordered If you have labs (blood work) drawn today and your tests are completely normal, you will receive your results only by: Marland Kitchen MyChart Message (if you have MyChart) OR . A paper copy in the mail If you have any lab test that is abnormal or we need to change your treatment, we will call you to review the results.   Testing/Procedures: None ordered   Follow-Up: At [facility], you and your health needs are our priority.  As part of our continuing mission to provide you with exceptional heart care, we have created designated Provider Care Teams.  These Care Teams include your primary Cardiologist (physician) and Advanced Practice Providers (APPs -  Physician Assistants and Nurse Practitioners) who all work together to provide you with the care you need, when you need it.  We recommend signing up for the patient portal called "MyChart".  Sign up information is provided on this After Visit Summary.  MyChart is used to connect with patients for Virtual Visits (Telemedicine).  Patients are able to view lab/test results, encounter notes, upcoming appointments, etc.  Non-urgent messages can be sent to your provider as well.   To learn more about what you can do with MyChart, go to [email or web address].    Your next appointment:   Follow up with Dr. Rollene Rotunda, MD on an as-needed basis   Other Instructions None

## 2019-12-05 ENCOUNTER — Ambulatory Visit: Payer: PRIVATE HEALTH INSURANCE | Attending: Internal Medicine

## 2019-12-05 ENCOUNTER — Other Ambulatory Visit: Payer: Self-pay

## 2019-12-05 VITALS — Temp 97.9°F

## 2019-12-05 DIAGNOSIS — Z23 Encounter for immunization: Secondary | ICD-10-CM

## 2019-12-05 MED FILL — METFORMIN HCL 500 MG TABS: 500 | 30 days supply | Qty: 30 | Fill #11

## 2019-12-05 MED FILL — DILTIAZEM 24HR ER 240 MG CA: 240 | 30 days supply | Qty: 30 | Fill #2

## 2019-12-05 MED FILL — SPIRONOLACTONE 25 MG TABLET: 25 | 30 days supply | Qty: 30 | Fill #2

## 2019-12-05 NOTE — Progress Notes (Signed)
Pt in clinic for flu vaccine, administered in R deltoid w/o adverse reaction noted, pt tolerated well, visit completed w/ language assistance via [employer] (Twi interpreter, [place], [phone])

## 2019-12-26 MED FILL — LOSARTAN POTASSIUM 100 MG T: 100 | 30 days supply | Qty: 30 | Fill #1

## 2019-12-26 MED FILL — ATORVASTATIN 10 MG TABLET: 10 | 30 days supply | Qty: 30 | Fill #4

## 2019-12-26 MED FILL — CARVEDILOL 6.25 MG TABLET: 6.25 | 30 days supply | Qty: 60 | Fill #3

## 2020-01-03 MED FILL — METFORMIN HCL 500 MG TABS: 500 | 30 days supply | Qty: 30 | Fill #12

## 2020-01-03 MED FILL — SPIRONOLACTONE 25 MG TABLET: 25 | 30 days supply | Qty: 30 | Fill #3

## 2020-01-03 MED FILL — DILTIAZEM 24HR ER 240 MG CA: 240 | 30 days supply | Qty: 30 | Fill #3

## 2020-01-30 MED FILL — ATORVASTATIN 10 MG TABLET: 10 | 30 days supply | Qty: 30 | Fill #5

## 2020-01-30 MED FILL — CARVEDILOL 6.25 MG TABLET: 6.25 | 30 days supply | Qty: 60 | Fill #4

## 2020-01-30 MED FILL — METFORMIN HCL 500 MG TABS: 500 | 30 days supply | Qty: 30 | Fill #0

## 2020-01-30 MED FILL — LOSARTAN POTASSIUM 100 MG T: 100 | 30 days supply | Qty: 30 | Fill #2

## 2020-01-30 MED FILL — DILTIAZEM 24HR ER 240 MG CA: 240 | 30 days supply | Qty: 30 | Fill #4

## 2020-01-30 MED FILL — SPIRONOLACTONE 25 MG TABLET: 25 | 30 days supply | Qty: 30 | Fill #4

## 2020-02-24 ENCOUNTER — Other Ambulatory Visit: Payer: Self-pay | Admitting: Internal Medicine

## 2020-02-24 DIAGNOSIS — I10 Essential (primary) hypertension: Secondary | ICD-10-CM

## 2020-02-24 MED FILL — ATORVASTATIN 10 MG TABLET: 10 | 30 days supply | Qty: 30 | Fill #6

## 2020-02-24 MED FILL — LOSARTAN POTASSIUM 100 MG T: 100 | 30 days supply | Qty: 30 | Fill #0

## 2020-02-24 MED FILL — CARVEDILOL 6.25 MG TABLET: 6.25 | 30 days supply | Qty: 60 | Fill #5

## 2020-02-24 NOTE — Telephone Encounter (Signed)
Courtesy refill given, appointment needed. 

## 2020-03-05 MED FILL — SPIRONOLACTONE 25 MG TABLET: 25 | 30 days supply | Qty: 30 | Fill #5

## 2020-03-05 MED FILL — METFORMIN HCL 500 MG TABS: 500 | 30 days supply | Qty: 30 | Fill #1

## 2020-03-05 MED FILL — DILTIAZEM 24HR ER 240 MG CA: 240 | 30 days supply | Qty: 30 | Fill #5

## 2020-03-28 ENCOUNTER — Other Ambulatory Visit: Payer: Self-pay | Admitting: Internal Medicine

## 2020-03-28 DIAGNOSIS — I10 Essential (primary) hypertension: Secondary | ICD-10-CM

## 2020-03-28 MED FILL — ATORVASTATIN 10 MG TABLET: 10 | 30 days supply | Qty: 30 | Fill #7

## 2020-03-28 NOTE — Telephone Encounter (Signed)
Requested medication (s) are due for refill today:   Yes for both  Requested medication (s) are on the active medication list:   Yes for both  Future visit scheduled:   Yes 05/01/2020   Last ordered: Coreg 09/22/2019 #60, 5 refills;       Losartan 02/24/2020 #30, 0 refills  Clinic note:  Returned because 30 day courtesy refill has been given for losartan.  Provider to review for refill until appt since courtesy refill has been given.   Requested Prescriptions  Pending Prescriptions Disp Refills   carvedilol (COREG) 6.25 MG tablet [Pharmacy Med Name: CARVEDILOL 6.25 MG TABLET 6.25 Tablet] 60 tablet 5    Sig: TAKE 1 TABLET (6.25 MG TOTAL) BY MOUTH 2 (TWO) TIMES DAILY WITH A MEAL.      Cardiovascular:  Beta Blockers Failed - 03/28/2020  8:39 AM      Failed - Last BP in normal range    BP Readings from Last 1 Encounters:  12/02/19 (!) 155/93          Failed - Valid encounter within last 6 months    Recent Outpatient Visits           7 months ago Type 2 diabetes mellitus with diabetic polyneuropathy, without long-term current use of insulin (HCC)   [facility] [place], Gavin Pound B, MD   1 year ago Type 2 diabetes mellitus with diabetic polyneuropathy, without long-term current use of insulin (HCC)   [facility] Marcine Matar, MD   1 year ago Pap smear for cervical cancer screening   [place] Jonah Blue B, MD   1 year ago Type 2 diabetes mellitus with diabetic polyneuropathy, without long-term current use of insulin (HCC)   [facility] Marcine Matar, MD   1 year ago Essential hypertension   [facility] Storm Frisk, MD       Future Appointments             In 1 month Marcine Matar, MD [facility]             Passed - Last Heart Rate in normal range    Pulse Readings from  Last 1 Encounters:  12/02/19 68            losartan (COZAAR) 100 MG tablet [Pharmacy Med Name: LOSARTAN POTASSIUM 100 MG T 100 Tablet] 30 tablet 0    Sig: TAKE 1 TABLET (100 MG TOTAL) BY MOUTH DAILY. OFFICE VISIT NEEDED FOR ADDITIONAL REFILLS      Cardiovascular:  Angiotensin Receptor Blockers Failed - 03/28/2020  8:39 AM      Failed - Cr in normal range and within 180 days    Creatinine, Ser  Date Value Ref Range Status  01/14/2019 0.81 0.57 - 1.00 mg/dL Final          Failed - K in normal range and within 180 days    Potassium  Date Value Ref Range Status  08/23/2019 4.8 3.5 - 5.2 mmol/L Final          Failed - Last BP in normal range    BP Readings from Last 1 Encounters:  12/02/19 (!) 155/93          Failed - Valid encounter within last 6 months    Recent Outpatient Visits  7 months ago Type 2 diabetes mellitus with diabetic polyneuropathy, without long-term current use of insulin (HCC)   [place] [place], Gavin Pound B, MD   1 year ago Type 2 diabetes mellitus with diabetic polyneuropathy, without long-term current use of insulin (HCC)   [facility] Marcine Matar, MD   1 year ago Pap smear for cervical cancer screening   [place] [facility] Marcine Matar, MD   1 year ago Type 2 diabetes mellitus with diabetic polyneuropathy, without long-term current use of insulin [facility])   [facility] [facility] Marcine Matar, MD   1 year ago Essential hypertension   [place] [facility] Storm Frisk, MD       Future Appointments             In 1 month Laural Benes Binnie Rail, MD [facility]             Passed - Patient is not pregnant

## 2020-03-29 ENCOUNTER — Other Ambulatory Visit: Payer: Self-pay | Admitting: Internal Medicine

## 2020-03-29 MED FILL — CARVEDILOL 6.25 MG TABLET: 6.25 | 30 days supply | Qty: 60 | Fill #0

## 2020-03-29 MED FILL — LOSARTAN POTASSIUM 100 MG T: 100 | 30 days supply | Qty: 30 | Fill #0

## 2020-04-04 ENCOUNTER — Other Ambulatory Visit: Payer: Self-pay | Admitting: Internal Medicine

## 2020-04-04 DIAGNOSIS — I10 Essential (primary) hypertension: Secondary | ICD-10-CM

## 2020-04-04 MED FILL — METFORMIN HCL 500 MG TABS: 500 | 30 days supply | Qty: 30 | Fill #2

## 2020-04-04 NOTE — Telephone Encounter (Signed)
Requested medication (s) are due for refill today: yes  Requested medication (s) are on the active medication list: yes  Last refill:  03/05/2020  Future visit scheduled: yes  Notes to clinic: Patient has been schedule for 05/01/2020 Review for another courtesy refill until that appt    Requested Prescriptions  Pending Prescriptions Disp Refills   spironolactone (ALDACTONE) 25 MG tablet [Pharmacy Med Name: SPIRONOLACTONE 25 MG TABLET 25 Tablet] 30 tablet 5    Sig: TAKE 1 TABLET (25 MG TOTAL) BY MOUTH DAILY.      Cardiovascular: Diuretics - Aldosterone Antagonist Failed - 04/04/2020  2:33 PM      Failed - Cr in normal range and within 360 days    Creatinine, Ser  Date Value Ref Range Status  01/14/2019 0.81 0.57 - 1.00 mg/dL Final          Failed - Na in normal range and within 360 days    Sodium  Date Value Ref Range Status  01/14/2019 141 134 - 144 mmol/L Final          Failed - Last BP in normal range    BP Readings from Last 1 Encounters:  12/02/19 (!) 155/93          Failed - Valid encounter within last 6 months    Recent Outpatient Visits           7 months ago Type 2 diabetes mellitus with diabetic polyneuropathy, without long-term current use of insulin (HCC)   [place] [facility] [place], Gavin Pound B, MD   1 year ago Type 2 diabetes mellitus with diabetic polyneuropathy, without long-term current use of insulin (HCC)   [facility] Marcine Matar, MD   1 year ago Pap smear for cervical cancer screening   [place] [facility] Jonah Blue B, MD   1 year ago Type 2 diabetes mellitus with diabetic polyneuropathy, without long-term current use of insulin (HCC)   [place] Jonah Blue B, MD   1 year ago Essential hypertension   [facility] Storm Frisk, MD       Future Appointments             In 3 weeks Marcine Matar, MD [facility]             Passed - K in normal range and within 360 days    Potassium  Date Value Ref Range Status  08/23/2019 4.8 3.5 - 5.2 mmol/L Final            diltiazem (CARDIZEM CD) 240 MG 24 hr capsule [Pharmacy Med Name: DILTIAZEM 24HR ER 240 MG CA 240 Capsule] 30 capsule 5    Sig: TAKE 1 CAPSULE (240 MG TOTAL) BY MOUTH DAILY.      Cardiovascular:  Calcium Channel Blockers Failed - 04/04/2020  2:33 PM      Failed - Last BP in normal range    BP Readings from Last 1 Encounters:  12/02/19 (!) 155/93          Failed - Valid encounter within last 6 months    Recent Outpatient Visits           7 months ago Type 2 diabetes mellitus with diabetic polyneuropathy, without long-term current use of insulin [facility])   [place] [facility] Marcine Matar, MD   1 year ago  Type 2 diabetes mellitus with diabetic polyneuropathy, without long-term current use of insulin [facility])   [place] [facility] Marcine Matar, MD   1 year ago Pap smear for cervical cancer screening   [facility] Marcine Matar, MD   1 year ago Type 2 diabetes mellitus with diabetic polyneuropathy, without long-term current use of insulin [facility])   [facility] Marcine Matar, MD   1 year ago Essential hypertension   [facility] Storm Frisk, MD       Future Appointments             In 3 weeks Marcine Matar, MD [facility]

## 2020-04-05 ENCOUNTER — Other Ambulatory Visit: Payer: Self-pay | Admitting: Internal Medicine

## 2020-04-05 MED FILL — CARTIA XT 120 MG CP24: 120 | 30 days supply | Qty: 30 | Fill #0

## 2020-04-06 MED FILL — SPIRONOLACTONE 25 MG TABLET: 25 | 30 days supply | Qty: 30 | Fill #0

## 2020-05-01 ENCOUNTER — Encounter: Payer: Self-pay | Admitting: Internal Medicine

## 2020-05-01 ENCOUNTER — Other Ambulatory Visit: Payer: Self-pay

## 2020-05-01 ENCOUNTER — Ambulatory Visit: Payer: BC Managed Care – PPO | Attending: Internal Medicine | Admitting: Internal Medicine

## 2020-05-01 ENCOUNTER — Other Ambulatory Visit: Payer: Self-pay | Admitting: Internal Medicine

## 2020-05-01 DIAGNOSIS — M79605 Pain in left leg: Secondary | ICD-10-CM

## 2020-05-01 DIAGNOSIS — I480 Paroxysmal atrial fibrillation: Secondary | ICD-10-CM

## 2020-05-01 DIAGNOSIS — E1169 Type 2 diabetes mellitus with other specified complication: Secondary | ICD-10-CM

## 2020-05-01 DIAGNOSIS — I10 Essential (primary) hypertension: Secondary | ICD-10-CM

## 2020-05-01 DIAGNOSIS — E785 Hyperlipidemia, unspecified: Secondary | ICD-10-CM

## 2020-05-01 DIAGNOSIS — Z1211 Encounter for screening for malignant neoplasm of colon: Secondary | ICD-10-CM

## 2020-05-01 LAB — POCT GLYCOSYLATED HEMOGLOBIN (HGB A1C): HbA1c, POC (controlled diabetic range): 6.3 % (ref 0.0–7.0)

## 2020-05-01 LAB — GLUCOSE, POCT (MANUAL RESULT ENTRY): POC Glucose: 119 mg/dl — AB (ref 70–99)

## 2020-05-01 MED ORDER — SPIRONOLACTONE 25 MG PO TABS
ORAL_TABLET | Freq: Every day | ORAL | 1 refills | Status: DC
  Filled 2020-05-01: qty 30, 30d supply, fill #0
  Filled 2020-05-30: qty 30, 30d supply, fill #1
  Filled 2020-06-29: qty 30, 30d supply, fill #2
  Filled 2020-07-31: qty 30, 30d supply, fill #3
  Filled 2020-09-05: qty 30, 30d supply, fill #4
  Filled 2020-10-03: qty 30, 30d supply, fill #5

## 2020-05-01 MED ORDER — DILTIAZEM HCL ER COATED BEADS 240 MG PO CP24
ORAL_CAPSULE | Freq: Every day | ORAL | 6 refills | Status: DC
  Filled 2020-05-01: qty 30, 30d supply, fill #0
  Filled 2020-06-07: qty 30, 30d supply, fill #1
  Filled 2020-07-06: qty 30, 30d supply, fill #2
  Filled 2020-08-08: qty 30, 30d supply, fill #3
  Filled 2020-09-05: qty 30, 30d supply, fill #4
  Filled 2020-10-03: qty 30, 30d supply, fill #5
  Filled 2020-11-01: qty 30, 30d supply, fill #6

## 2020-05-01 MED ORDER — METFORMIN HCL 500 MG PO TABS
ORAL_TABLET | Freq: Every day | ORAL | 1 refills | Status: DC
  Filled 2020-05-01: qty 30, 30d supply, fill #0
  Filled 2020-06-07 (×3): qty 30, 30d supply, fill #1
  Filled 2020-07-06: qty 30, 30d supply, fill #2
  Filled 2020-08-08: qty 30, 30d supply, fill #3
  Filled 2020-09-05: qty 30, 30d supply, fill #4
  Filled 2020-10-03: qty 30, 30d supply, fill #5

## 2020-05-01 MED ORDER — ATORVASTATIN CALCIUM 10 MG PO TABS
ORAL_TABLET | Freq: Every day | ORAL | 3 refills | Status: DC
  Filled 2020-05-01: qty 30, 30d supply, fill #0
  Filled 2020-05-30: qty 30, 30d supply, fill #1
  Filled 2020-06-29: qty 30, 30d supply, fill #2
  Filled 2020-07-31: qty 30, 30d supply, fill #3
  Filled 2020-09-05: qty 30, 30d supply, fill #4
  Filled 2020-10-03: qty 30, 30d supply, fill #5
  Filled 2020-11-01: qty 30, 30d supply, fill #6
  Filled 2020-12-03: qty 30, 30d supply, fill #7
  Filled 2021-01-04: qty 30, 30d supply, fill #8

## 2020-05-01 MED ORDER — CARVEDILOL 6.25 MG PO TABS
ORAL_TABLET | Freq: Two times a day (BID) | ORAL | 1 refills | Status: DC
  Filled 2020-05-01: qty 60, 30d supply, fill #0
  Filled 2020-05-30: qty 60, 30d supply, fill #1
  Filled 2020-06-29: qty 60, 30d supply, fill #2
  Filled 2020-07-31: qty 60, 30d supply, fill #3
  Filled 2020-09-05: qty 60, 30d supply, fill #4
  Filled 2020-10-03: qty 60, 30d supply, fill #5

## 2020-05-01 MED FILL — Atorvastatin Calcium Tab 10 MG (Base Equivalent): ORAL | 30 days supply | Qty: 30 | Fill #0 | Status: CN

## 2020-05-01 NOTE — Patient Instructions (Addendum)
Please let me know via MyChart the date of your last eye exam.  Diabetes Mellitus and Exercise Exercising regularly is important for overall health, especially for people who have diabetes mellitus. Exercising is not only about losing weight. It has many other health benefits, such as increasing muscle strength and bone density and reducing body fat and stress. This leads to improved fitness, flexibility, and endurance, all of which result in better overall health. What are the benefits of exercise if I have diabetes? Exercise has many benefits for people with diabetes. They include:  Helping to lower and control blood sugar (glucose).  Helping the body to respond better to the hormone insulin by improving insulin sensitivity.  Reducing how much insulin the body needs.  Lowering the risk for heart disease by: ? Lowering "bad" cholesterol and triglyceride levels. ? Increasing "good" cholesterol levels. ? Lowering blood pressure. ? Lowering blood glucose levels. What is my activity plan? Your health care provider or certified diabetes educator can help you make a plan for the type and frequency of exercise that works for you. This is called your activity plan. Be sure to:  Get at least 150 minutes of medium-intensity or high-intensity exercise each week. Exercises may include brisk walking, biking, or water aerobics.  Do stretching and strengthening exercises, such as yoga or weight lifting, at least 2 times a week.  Spread out your activity over at least 3 days of the week.  Get some form of physical activity each day. ? Do not go more than 2 days in a row without some kind of physical activity. ? Avoid being inactive for more than 90 minutes at a time. Take frequent breaks to walk or stretch.  Choose exercises or activities that you enjoy. Set realistic goals.  Start slowly and gradually increase your exercise intensity over time.   How do I manage my diabetes during  exercise? Monitor your blood glucose  Check your blood glucose before and after exercising. If your blood glucose is: ? 240 mg/dL (98.2 mmol/L) or higher before you exercise, check your urine for ketones. These are chemicals created by the liver. If you have ketones in your urine, do not exercise until your blood glucose returns to normal. ? 100 mg/dL (5.6 mmol/L) or lower, eat a snack containing 15-20 grams of carbohydrate. Check your blood glucose 15 minutes after the snack to make sure that your glucose level is above 100 mg/dL (5.6 mmol/L) before you start your exercise.  Know the symptoms of low blood glucose (hypoglycemia) and how to treat it. Your risk for hypoglycemia increases during and after exercise. Follow these tips and your health care provider's instructions  Keep a carbohydrate snack that is fast-acting for use before, during, and after exercise to help prevent or treat hypoglycemia.  Avoid injecting insulin into areas of the body that are going to be exercised. For example, avoid injecting insulin into: ? Your arms, when you are about to play tennis. ? Your legs, when you are about to go jogging.  Keep records of your exercise habits. Doing this can help you and your health care provider adjust your diabetes management plan as needed. Write down: ? Food that you eat before and after you exercise. ? Blood glucose levels before and after you exercise. ? The type and amount of exercise you have done.  Work with your health care provider when you start a new exercise or activity. He or she may need to: ? Make sure that the  activity is safe for you. ? Adjust your insulin, other medicines, and food that you eat.  Drink plenty of water while you exercise. This prevents loss of water (dehydration) and problems caused by a lot of heat in the body (heat stroke).   Where to find more information  American Diabetes Association: www.diabetes.org Summary  Exercising regularly is  important for overall health, especially for people who have diabetes mellitus.  Exercising has many health benefits. It increases muscle strength and bone density and reduces body fat and stress. It also lowers and controls blood glucose.  Your health care provider or certified diabetes educator can help you make an activity plan for the type and frequency of exercise that works for you.  Work with your health care provider to make sure any new activity is safe for you. Also work with your health care provider to adjust your insulin, other medicines, and the food you eat. This information is not intended to replace advice given to you by your health care provider. Make sure you discuss any questions you have with your health care provider. Document Revised: 10/11/2018 Document Reviewed: 10/11/2018 Elsevier Patient Education  2021 [employer].
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
## 2020-05-02 ENCOUNTER — Other Ambulatory Visit: Payer: Self-pay

## 2020-05-02 ENCOUNTER — Ambulatory Visit (HOSPITAL_COMMUNITY)
Admission: RE | Admit: 2020-05-02 | Discharge: 2020-05-02 | Disposition: A | Discharge status: Home / Self Care | Payer: BC Managed Care – PPO | Source: Ambulatory Visit | Attending: Internal Medicine | Admitting: Internal Medicine

## 2020-05-02 DIAGNOSIS — M79605 Pain in left leg: Secondary | ICD-10-CM | POA: Insufficient documentation

## 2020-05-02 LAB — COMPREHENSIVE METABOLIC PANEL
ALT: 22 IU/L (ref 0–32)
AST: 18 IU/L (ref 0–40)
Albumin/Globulin Ratio: 1.5 (ref 1.2–2.2)
Albumin: 4.5 g/dL (ref 3.8–4.8)
Alkaline Phosphatase: 105 IU/L (ref 44–121)
BUN/Creatinine Ratio: 19 (ref 12–28)
BUN: 15 mg/dL (ref 8–27)
Bilirubin Total: <0.2 mg/dL (ref 0.0–1.2)
CO2: 24 mmol/L (ref 20–29)
Calcium: 10.2 mg/dL (ref 8.7–10.3)
Chloride: 102 mmol/L (ref 96–106)
Creatinine, Ser: 0.79 mg/dL (ref 0.57–1.00)
Globulin, Total: 3 g/dL (ref 1.5–4.5)
Glucose: 98 mg/dL (ref 65–99)
Potassium: 4 mmol/L (ref 3.5–5.2)
Sodium: 140 mmol/L (ref 134–144)
Total Protein: 7.5 g/dL (ref 6.0–8.5)
eGFR: 85 mL/min/{1.73_m2} (ref >59)

## 2020-05-02 LAB — CBC
Hematocrit: 44 % (ref 34.0–46.6)
Hemoglobin: 14.4 g/dL (ref 11.1–15.9)
MCH: 28.6 pg (ref 26.6–33.0)
MCHC: 32.7 g/dL (ref 31.5–35.7)
MCV: 88 fL (ref 79–97)
Platelets: 238 10*3/uL (ref 150–450)
RBC: 5.03 x10E6/uL (ref 3.77–5.28)
RDW: 12.3 % (ref 11.7–15.4)
WBC: 11.7 10*3/uL — ABNORMAL HIGH (ref 3.4–10.8)

## 2020-05-02 LAB — LIPID PANEL
Chol/HDL Ratio: 2.3 ratio (ref 0.0–4.4)
Cholesterol, Total: 144 mg/dL (ref 100–199)
HDL: 62 mg/dL (ref >39)
LDL Chol Calc (NIH): 65 mg/dL (ref 0–99)
Triglycerides: 90 mg/dL (ref 0–149)
VLDL Cholesterol Cal: 17 mg/dL (ref 5–40)

## 2020-05-02 LAB — MICROALBUMIN / CREATININE URINE RATIO
Creatinine, Urine: 178.9 mg/dL
Microalb/Creat Ratio: 16 mg/g creat (ref 0–29)
Microalbumin, Urine: 28.9 ug/mL

## 2020-05-03 ENCOUNTER — Other Ambulatory Visit: Payer: Self-pay

## 2020-05-03 MED ORDER — LOSARTAN POTASSIUM 100 MG PO TABS
100.0000 mg | ORAL_TABLET | Freq: Every day | ORAL | 2 refills | Status: DC
  Filled 2020-05-03 – 2020-05-14 (×2): qty 30, 30d supply, fill #0
  Filled 2020-06-07: qty 30, 30d supply, fill #1
  Filled 2020-07-06: qty 30, 30d supply, fill #2

## 2020-05-14 ENCOUNTER — Other Ambulatory Visit: Payer: Self-pay

## 2020-05-30 ENCOUNTER — Other Ambulatory Visit: Payer: Self-pay

## 2020-06-07 ENCOUNTER — Telehealth: Payer: Self-pay | Admitting: Internal Medicine

## 2020-06-07 ENCOUNTER — Other Ambulatory Visit: Payer: Self-pay

## 2020-06-07 NOTE — Telephone Encounter (Signed)
Copied from CRM [id number]. Topic: General - Other >> Jun 07, 2020  2:06 PM Gaetana Michaelis A wrote: Reason for CRM: Patient's daughter would like to speak with a member of pharmacy staff when possible  Patient's daughter has questions about medications and would also like to submit refill requests   Patient's daughter is attempting to order medication's before the weekend  Please contact to further advise when available

## 2020-06-08 NOTE — Telephone Encounter (Signed)
No assistance needed at this time.

## 2020-06-29 ENCOUNTER — Other Ambulatory Visit: Payer: Self-pay

## 2020-07-06 ENCOUNTER — Other Ambulatory Visit: Payer: Self-pay

## 2020-07-31 ENCOUNTER — Other Ambulatory Visit: Payer: Self-pay

## 2020-08-08 ENCOUNTER — Other Ambulatory Visit: Payer: Self-pay | Admitting: Internal Medicine

## 2020-08-08 ENCOUNTER — Other Ambulatory Visit: Payer: Self-pay

## 2020-08-08 DIAGNOSIS — I10 Essential (primary) hypertension: Secondary | ICD-10-CM

## 2020-08-08 MED ORDER — LOSARTAN POTASSIUM 100 MG PO TABS
100.0000 mg | ORAL_TABLET | Freq: Every day | ORAL | 0 refills | Status: DC
  Filled 2020-08-08: qty 30, 30d supply, fill #0
  Filled 2020-09-05: qty 30, 30d supply, fill #1
  Filled 2020-10-03: qty 30, 30d supply, fill #2

## 2020-09-05 ENCOUNTER — Other Ambulatory Visit: Payer: Self-pay

## 2020-09-06 ENCOUNTER — Other Ambulatory Visit: Payer: Self-pay

## 2020-09-21 IMAGING — MG DIGITAL SCREENING BILAT W/ CAD
5 series · 5 of 5 positions shown · non-contrast
Comparison: None.

CLINICAL DATA: Screening.

EXAM:
DIGITAL SCREENING BILATERAL MAMMOGRAM WITH CAD

[L CC]
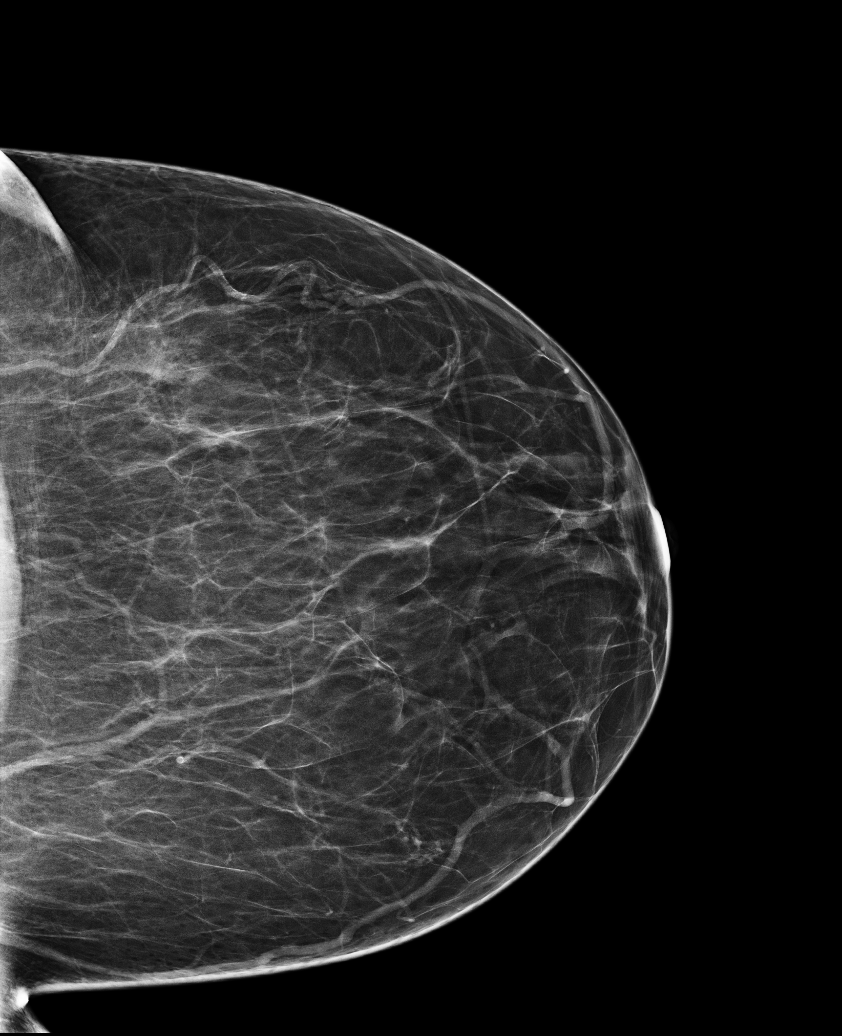

[L MLO]
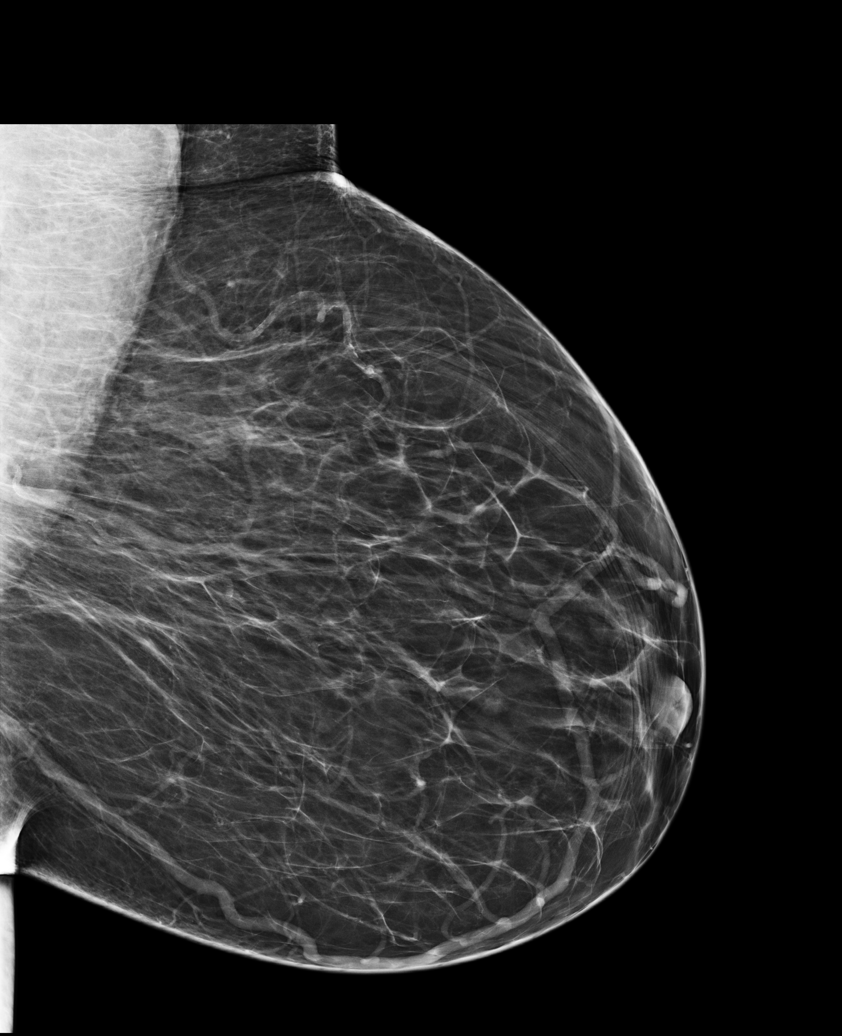

[R CC (1 of 2)]
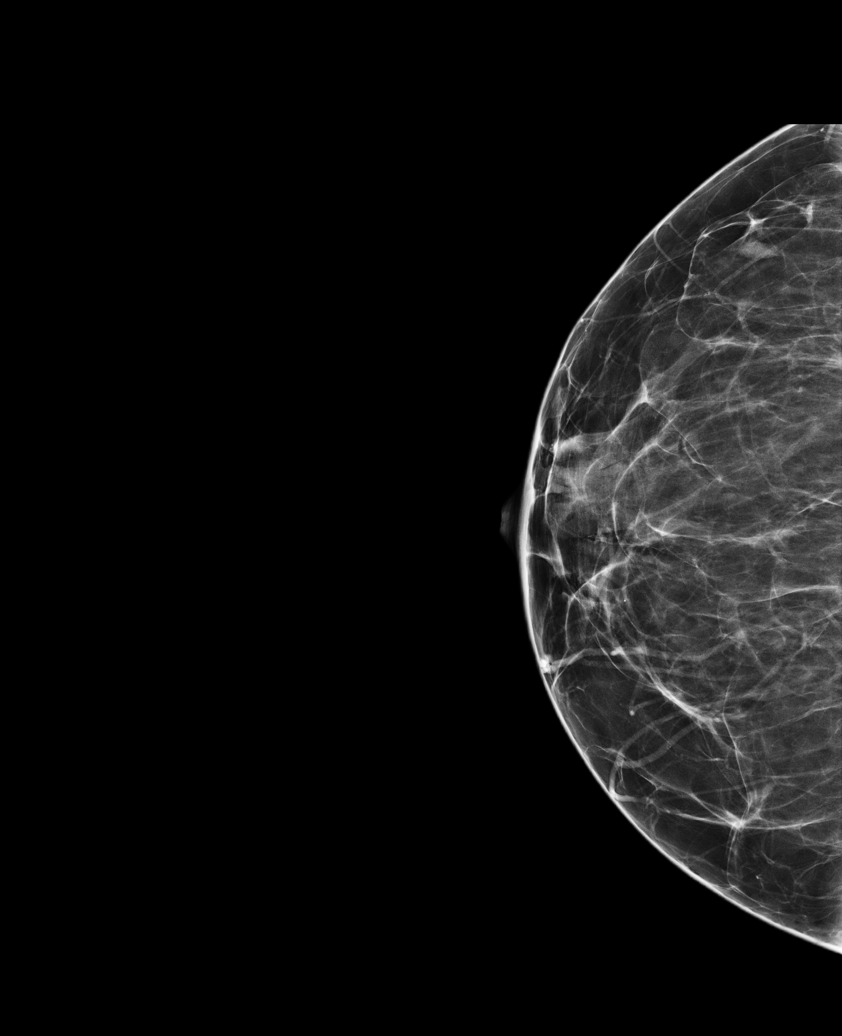

[R MLO]
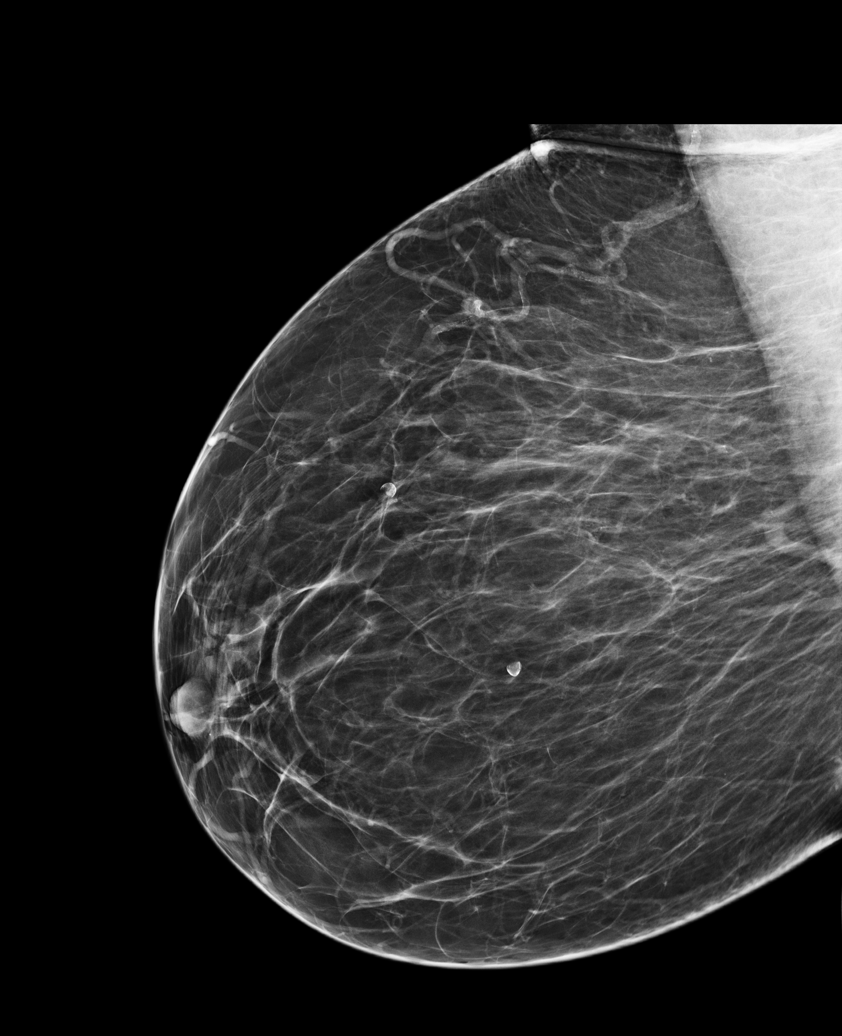

[R CC (2 of 2)]
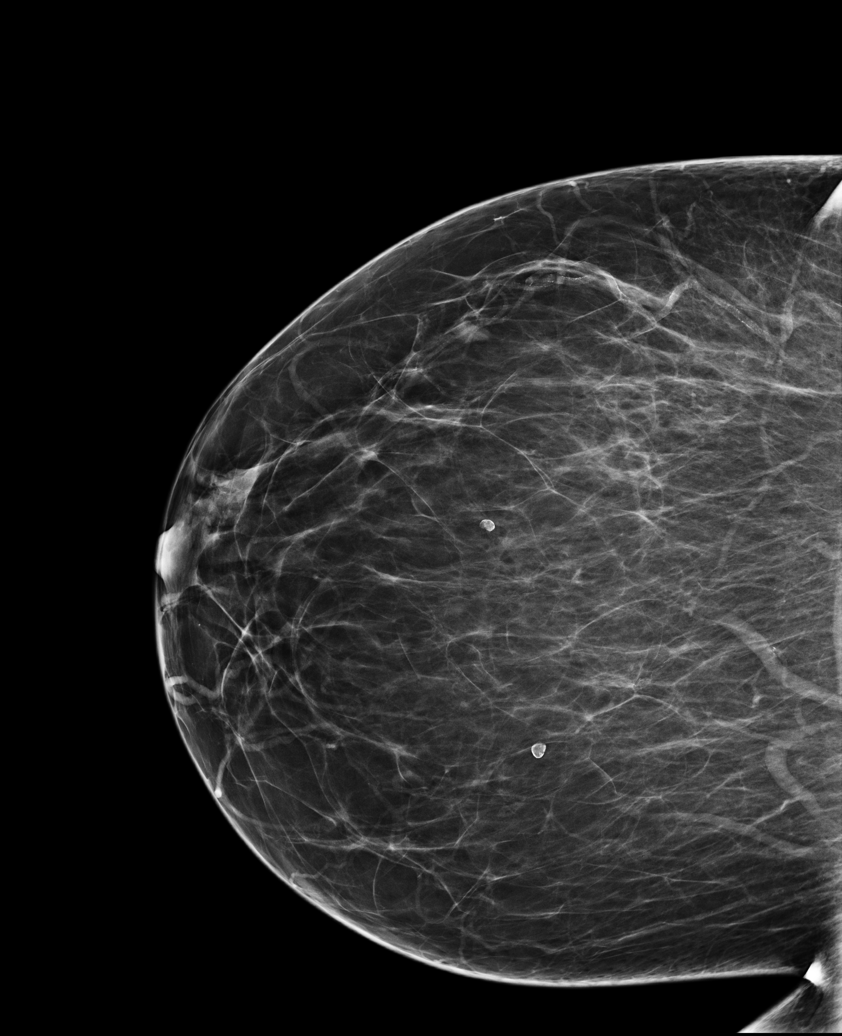

[5 of 5 positions shown; findings below may reference images not displayed]

ACR Breast Density Category b: There are scattered areas of
fibroglandular density.
FINDINGS: There are no findings suspicious for malignancy. Images were
processed with CAD.
IMPRESSION: No mammographic evidence of malignancy. A result letter of this
screening mammogram will be mailed directly to the patient.

RECOMMENDATION:
Screening mammogram in one year. (Code:[id number])

BI-RADS CATEGORY  1: Negative.

## 2020-10-03 ENCOUNTER — Other Ambulatory Visit: Payer: Self-pay

## 2020-11-01 ENCOUNTER — Other Ambulatory Visit: Payer: Self-pay

## 2020-11-01 ENCOUNTER — Other Ambulatory Visit: Payer: Self-pay | Admitting: Internal Medicine

## 2020-11-01 DIAGNOSIS — E1169 Type 2 diabetes mellitus with other specified complication: Secondary | ICD-10-CM

## 2020-11-01 DIAGNOSIS — I10 Essential (primary) hypertension: Secondary | ICD-10-CM

## 2020-11-02 ENCOUNTER — Other Ambulatory Visit: Payer: Self-pay

## 2020-11-02 MED ORDER — SPIRONOLACTONE 25 MG PO TABS
ORAL_TABLET | Freq: Every day | ORAL | 0 refills | Status: DC
  Filled 2020-11-02: qty 90, 90d supply, fill #0
  Filled 2020-11-02: qty 30, 30d supply, fill #0
  Filled 2020-12-03: qty 30, 30d supply, fill #1
  Filled 2021-01-04: qty 30, 30d supply, fill #2

## 2020-11-02 MED ORDER — CARVEDILOL 6.25 MG PO TABS
ORAL_TABLET | Freq: Two times a day (BID) | ORAL | 0 refills | Status: DC
  Filled 2020-11-02: qty 180, 90d supply, fill #0
  Filled 2020-11-02: qty 60, 30d supply, fill #0
  Filled 2020-12-03: qty 60, 30d supply, fill #1
  Filled 2021-01-04: qty 60, 30d supply, fill #2

## 2020-11-02 MED ORDER — LOSARTAN POTASSIUM 100 MG PO TABS
100.0000 mg | ORAL_TABLET | Freq: Every day | ORAL | 0 refills | Status: DC
  Filled 2020-11-02: qty 30, 30d supply, fill #0
  Filled 2020-11-02: qty 90, 90d supply, fill #0
  Filled 2020-12-03: qty 30, 30d supply, fill #1
  Filled 2021-01-04: qty 30, 30d supply, fill #2

## 2020-11-02 MED ORDER — METFORMIN HCL 500 MG PO TABS
ORAL_TABLET | Freq: Every day | ORAL | 0 refills | Status: DC
  Filled 2020-11-02: qty 90, 90d supply, fill #0
  Filled 2020-11-02: qty 30, 30d supply, fill #0
  Filled 2020-12-03: qty 30, 30d supply, fill #1
  Filled 2021-01-04: qty 30, 30d supply, fill #2

## 2020-12-03 ENCOUNTER — Other Ambulatory Visit: Payer: Self-pay | Admitting: Internal Medicine

## 2020-12-03 ENCOUNTER — Other Ambulatory Visit: Payer: Self-pay

## 2020-12-03 DIAGNOSIS — I10 Essential (primary) hypertension: Secondary | ICD-10-CM

## 2020-12-03 MED ORDER — DILTIAZEM HCL ER COATED BEADS 240 MG PO CP24
ORAL_CAPSULE | Freq: Every day | ORAL | 0 refills | Status: DC
  Filled 2020-12-03: qty 90, 90d supply, fill #0

## 2020-12-03 NOTE — Telephone Encounter (Signed)
Future OV 01/31/21 Approved per protocol.  Requested Prescriptions  Pending Prescriptions Disp Refills  . diltiazem (CARDIZEM CD) 240 MG 24 hr capsule 30 capsule 6    Sig: TAKE 1 CAPSULE BY MOUTH ONCE A DAY     Cardiovascular:  Calcium Channel Blockers Failed - 12/03/2020  9:45 AM      Failed - Valid encounter within last 6 months    Recent Outpatient Visits          7 months ago Type 2 diabetes mellitus with morbid obesity (HCC)   [facility] [place], Gavin Pound B, MD   1 year ago Type 2 diabetes mellitus with diabetic polyneuropathy, without long-term current use of insulin (HCC)   [place] [facility] Jonah Blue B, MD   1 year ago Type 2 diabetes mellitus with diabetic polyneuropathy, without long-term current use of insulin (HCC)   [place] [facility] Marcine Matar, MD   1 year ago Pap smear for cervical cancer screening   [place] [facility] Jonah Blue B, MD   2 years ago Type 2 diabetes mellitus with diabetic polyneuropathy, without long-term current use of insulin [facility])   [facility] Marcine Matar, MD      Future Appointments            In 1 month Marcine Matar, MD [facility]           Passed - Last BP in normal range    BP Readings from Last 1 Encounters:  05/01/20 130/80

## 2020-12-05 ENCOUNTER — Other Ambulatory Visit: Payer: Self-pay

## 2020-12-19 ENCOUNTER — Ambulatory Visit: Payer: BC Managed Care – PPO

## 2021-01-04 ENCOUNTER — Other Ambulatory Visit: Payer: Self-pay

## 2021-01-31 ENCOUNTER — Other Ambulatory Visit (HOSPITAL_COMMUNITY): Payer: Self-pay

## 2021-01-31 ENCOUNTER — Ambulatory Visit: Payer: BC Managed Care – PPO | Attending: Internal Medicine | Admitting: Internal Medicine

## 2021-01-31 ENCOUNTER — Encounter: Payer: Self-pay | Admitting: Internal Medicine

## 2021-01-31 ENCOUNTER — Other Ambulatory Visit: Payer: Self-pay

## 2021-01-31 DIAGNOSIS — Z23 Encounter for immunization: Secondary | ICD-10-CM

## 2021-01-31 DIAGNOSIS — E1169 Type 2 diabetes mellitus with other specified complication: Secondary | ICD-10-CM

## 2021-01-31 DIAGNOSIS — Z1211 Encounter for screening for malignant neoplasm of colon: Secondary | ICD-10-CM

## 2021-01-31 DIAGNOSIS — E785 Hyperlipidemia, unspecified: Secondary | ICD-10-CM

## 2021-01-31 DIAGNOSIS — I480 Paroxysmal atrial fibrillation: Secondary | ICD-10-CM | POA: Diagnosis not present

## 2021-01-31 DIAGNOSIS — I10 Essential (primary) hypertension: Secondary | ICD-10-CM

## 2021-01-31 MED ORDER — CARVEDILOL 6.25 MG PO TABS
6.2500 mg | ORAL_TABLET | Freq: Two times a day (BID) | ORAL | 1 refills | Status: DC
  Filled 2021-01-31 – 2021-02-06 (×2): qty 180, 90d supply, fill #0
  Filled 2021-05-09: qty 180, 90d supply, fill #1

## 2021-01-31 MED ORDER — LOSARTAN POTASSIUM 100 MG PO TABS
100.0000 mg | ORAL_TABLET | Freq: Every day | ORAL | 1 refills | Status: DC
  Filled 2021-01-31 – 2021-02-06 (×2): qty 90, 90d supply, fill #0
  Filled 2021-05-09: qty 90, 90d supply, fill #1

## 2021-01-31 MED ORDER — SPIRONOLACTONE 25 MG PO TABS
25.0000 mg | ORAL_TABLET | Freq: Every day | ORAL | 1 refills | Status: DC
  Filled 2021-01-31 – 2021-02-06 (×2): qty 90, 90d supply, fill #0
  Filled 2021-05-09: qty 90, 90d supply, fill #1

## 2021-01-31 MED ORDER — ATORVASTATIN CALCIUM 10 MG PO TABS
10.0000 mg | ORAL_TABLET | Freq: Every day | ORAL | 1 refills | Status: DC
  Filled 2021-01-31 – 2021-02-06 (×2): qty 90, 90d supply, fill #0
  Filled 2021-05-09: qty 90, 90d supply, fill #1

## 2021-01-31 MED ORDER — DILTIAZEM HCL ER COATED BEADS 240 MG PO CP24
240.0000 mg | ORAL_CAPSULE | Freq: Every day | ORAL | 1 refills | Status: DC
  Filled 2021-01-31: qty 90, fill #0
  Filled 2021-02-06 – 2021-03-05 (×2): qty 90, 90d supply, fill #0
  Filled 2021-05-09 – 2021-06-10 (×2): qty 90, 90d supply, fill #1

## 2021-01-31 MED ORDER — METFORMIN HCL 500 MG PO TABS
500.0000 mg | ORAL_TABLET | Freq: Every day | ORAL | 1 refills | Status: DC
  Filled 2021-01-31: qty 90, fill #0
  Filled 2021-02-06: qty 90, 90d supply, fill #0
  Filled 2021-05-09: qty 90, 90d supply, fill #1

## 2021-01-31 NOTE — Progress Notes (Signed)
Virtual Visit via Telephone Note  I connected with Shannon Khan on 01/31/2021 at 5:10 PM by telephone and verified that I am speaking with the correct person using two identifiers  Location: Patient: home Provider: office  Participants: Myself Patient and [name] her daughter who interprets.   I discussed the limitations, risks, security and privacy concerns of performing an evaluation and management service by telephone and the availability of in person appointments. I also discussed with the patient that there may be a patient responsible charge related to this service. The patient expressed understanding and agreed to proceed.  History of Present Illness: Patient with history of HTN, obesity, excision of goiter, OA knees, A.fib on Eliquis, new DM with  and neuropathy.  Last seen 04/2020.  Today's visit is for chronic disease management.  HTN/a.fib:  checks BP daily.  Gives him readings of 125/90, 130/85 Compliant with Losartan, Coreg, Diltiazem and Spironolactone, on ASA Limits salt in foods No CP/SOB/LE edema.  No palpitations.  She was taken off Eliquis by her cardiologist.  HL;  taking and tolerating Lipitor  DM/obesity: checks BS daily.  Range 95-120 Taking and tolerating metformin Doing well with eating habits.  Not too much exercise weight has stayed stable. -has upcoming appt with eye doctor later this mth  HM:  due for PCV 15.  Never received cologuard Outpatient Encounter Medications as of 01/31/2021  Medication Sig   aspirin EC 81 MG tablet Take 1 tablet (81 mg total) by mouth daily. Swallow whole.   atorvastatin (LIPITOR) 10 MG tablet TAKE 1 TABLET (10 MG TOTAL) BY MOUTH DAILY.   Blood Glucose Monitoring Suppl (ONETOUCH VERIO REFLECT) w/Device KIT 1 kit by Does not apply route 2 (two) times daily.   carvedilol (COREG) 6.25 MG tablet TAKE 1 TABLET (6.25 MG TOTAL) BY MOUTH 2 (TWO) TIMES DAILY WITH A MEAL.   diltiazem (CARDIZEM CD) 240 MG 24 hr capsule TAKE 1 CAPSULE BY  MOUTH ONCE A DAY   glucose blood (ONETOUCH VERIO) test strip Use as instructed   losartan (COZAAR) 100 MG tablet Take 1 tablet (100 mg total) by mouth daily.   metFORMIN (GLUCOPHAGE) 500 MG tablet TAKE 1 TABLET (500 MG TOTAL) BY MOUTH DAILY WITH BREAKFAST.   OneTouch Delica Lancets 95A MISC Use to check blood sugar twice daily.   spironolactone (ALDACTONE) 25 MG tablet TAKE 1 TABLET (25 MG TOTAL) BY MOUTH DAILY.   No facility-administered encounter medications on file as of 01/31/2021.      Observations/Objective: Blood pressure 135/86, pulse 71, resp. rate 16, weight 217 lb 3.2 oz (98.5 kg), SpO2 99 %., BMI 39.7 Wt Readings from Last 3 Encounters:  01/31/21 217 lb 3.2 oz (98.5 kg)  05/01/20 220 lb 9.6 oz (100.1 kg)  12/02/19 226 lb (102.5 kg)    Assessment and Plan: 1. Type 2 diabetes mellitus with morbid obesity ([place]) Reported blood sugars are at goal.  Continue healthy eating habits.  Encouraged her to try to move more.  Continue metformin. Obesity in combination with diabetes and hypertension. - Hemoglobin A1c; Future - metFORMIN (GLUCOPHAGE) 500 MG tablet; TAKE 1 TABLET (500 MG TOTAL) BY MOUTH DAILY WITH BREAKFAST.  Dispense: 90 tablet; Refill: 1  2. Essential hypertension Diastolic blood pressure remain slightly above goal.  She is on multiple antihypertensives.  No change made at this time. - Basic Metabolic Panel; Future - losartan (COZAAR) 100 MG tablet; Take 1 tablet (100 mg total) by mouth daily.  Dispense: 90 tablet; Refill: 1 - carvedilol (  COREG) 6.25 MG tablet; TAKE 1 TABLET (6.25 MG TOTAL) BY MOUTH 2 (TWO) TIMES DAILY WITH A MEAL.  Dispense: 180 tablet; Refill: 01 - diltiazem (CARDIZEM CD) 240 MG 24 hr capsule; TAKE 1 CAPSULE BY MOUTH ONCE A DAY  Dispense: 90 capsule; Refill: 1 - spironolactone (ALDACTONE) 25 MG tablet; Take 1 tablet (25 mg total) by mouth daily.  Dispense: 90 tablet; Refill: 1  3. Hyperlipidemia associated with type 2 diabetes mellitus (HCC) -  atorvastatin (LIPITOR) 10 MG tablet; TAKE 1 TABLET (10 MG TOTAL) BY MOUTH DAILY.  Dispense: 90 tablet; Refill: 1  4. PAF (paroxysmal atrial fibrillation) (HCC) Stable without recurrence of palpitation.  She is on aspirin.  5. Screening for colon cancer We will reorder the Cologuard test. - Cologuard  6. Need for vaccination against Streptococcus pneumoniae We will have her come in to see the clinical pharmacist to get PCV 15   Follow Up Instructions: 4 mths   I discussed the assessment and treatment plan with the patient. The patient was provided an opportunity to ask questions and all were answered. The patient agreed with the plan and demonstrated an understanding of the instructions.   The patient was advised to call back or seek an in-person evaluation if the symptoms worsen or if the condition fails to improve as anticipated.  I  Spent 13 minutes on this telephone encounter  Karle Plumber, MD

## 2021-02-01 ENCOUNTER — Other Ambulatory Visit (HOSPITAL_COMMUNITY): Payer: Self-pay

## 2021-02-06 ENCOUNTER — Ambulatory Visit: Payer: BC Managed Care – PPO | Attending: Internal Medicine

## 2021-02-06 ENCOUNTER — Other Ambulatory Visit: Payer: Self-pay

## 2021-02-06 ENCOUNTER — Other Ambulatory Visit (HOSPITAL_COMMUNITY): Payer: Self-pay

## 2021-02-06 DIAGNOSIS — I10 Essential (primary) hypertension: Secondary | ICD-10-CM

## 2021-02-06 DIAGNOSIS — E1169 Type 2 diabetes mellitus with other specified complication: Secondary | ICD-10-CM | POA: Diagnosis not present

## 2021-02-07 LAB — BASIC METABOLIC PANEL
BUN/Creatinine Ratio: 16 (ref 12–28)
BUN: 14 mg/dL (ref 8–27)
CO2: 25 mmol/L (ref 20–29)
Calcium: 10.5 mg/dL — ABNORMAL HIGH (ref 8.7–10.3)
Chloride: 105 mmol/L (ref 96–106)
Creatinine, Ser: 0.87 mg/dL (ref 0.57–1.00)
Glucose: 84 mg/dL (ref 70–99)
Potassium: 5 mmol/L (ref 3.5–5.2)
Sodium: 142 mmol/L (ref 134–144)
eGFR: 75 mL/min/{1.73_m2} (ref >59)

## 2021-02-07 LAB — HEMOGLOBIN A1C
Est. average glucose Bld gHb Est-mCnc: 140 mg/dL
Hgb A1c MFr Bld: 6.5 % — ABNORMAL HIGH (ref 4.8–5.6)

## 2021-02-11 ENCOUNTER — Other Ambulatory Visit: Payer: Self-pay

## 2021-03-05 ENCOUNTER — Other Ambulatory Visit: Payer: Self-pay

## 2021-05-09 ENCOUNTER — Other Ambulatory Visit (HOSPITAL_COMMUNITY): Payer: Self-pay

## 2021-05-10 DIAGNOSIS — H35033 Hypertensive retinopathy, bilateral: Secondary | ICD-10-CM | POA: Diagnosis not present

## 2021-05-10 DIAGNOSIS — E119 Type 2 diabetes mellitus without complications: Secondary | ICD-10-CM | POA: Diagnosis not present

## 2021-06-06 LAB — HM DIABETES EYE EXAM

## 2021-06-10 ENCOUNTER — Other Ambulatory Visit (HOSPITAL_COMMUNITY): Payer: Self-pay

## 2021-08-01 ENCOUNTER — Ambulatory Visit: Payer: BC Managed Care – PPO | Admitting: Internal Medicine

## 2021-08-07 ENCOUNTER — Other Ambulatory Visit (HOSPITAL_COMMUNITY): Payer: Self-pay

## 2021-08-07 ENCOUNTER — Other Ambulatory Visit: Payer: Self-pay | Admitting: Internal Medicine

## 2021-08-07 DIAGNOSIS — I10 Essential (primary) hypertension: Secondary | ICD-10-CM

## 2021-08-07 DIAGNOSIS — E1169 Type 2 diabetes mellitus with other specified complication: Secondary | ICD-10-CM

## 2021-08-07 MED ORDER — ATORVASTATIN CALCIUM 10 MG PO TABS
10.0000 mg | ORAL_TABLET | Freq: Every day | ORAL | 0 refills | Status: DC
  Filled 2021-08-07: qty 30, 30d supply, fill #0

## 2021-08-07 MED ORDER — METFORMIN HCL 500 MG PO TABS
500.0000 mg | ORAL_TABLET | Freq: Every day | ORAL | 0 refills | Status: DC
  Filled 2021-08-07: qty 30, 30d supply, fill #0

## 2021-08-07 MED ORDER — CARVEDILOL 6.25 MG PO TABS
6.2500 mg | ORAL_TABLET | Freq: Two times a day (BID) | ORAL | 0 refills | Status: DC
  Filled 2021-08-07: qty 60, 30d supply, fill #0

## 2021-08-07 MED ORDER — SPIRONOLACTONE 25 MG PO TABS
25.0000 mg | ORAL_TABLET | Freq: Every day | ORAL | 0 refills | Status: DC
  Filled 2021-08-07: qty 30, 30d supply, fill #0

## 2021-08-07 MED ORDER — LOSARTAN POTASSIUM 100 MG PO TABS
100.0000 mg | ORAL_TABLET | Freq: Every day | ORAL | 0 refills | Status: DC
  Filled 2021-08-07: qty 30, 30d supply, fill #0

## 2021-08-12 ENCOUNTER — Other Ambulatory Visit: Payer: Self-pay

## 2021-08-28 ENCOUNTER — Ambulatory Visit: Payer: BC Managed Care – PPO | Admitting: Physician Assistant

## 2021-09-02 ENCOUNTER — Other Ambulatory Visit: Payer: Self-pay | Admitting: Internal Medicine

## 2021-09-02 DIAGNOSIS — I10 Essential (primary) hypertension: Secondary | ICD-10-CM

## 2021-09-02 DIAGNOSIS — E785 Hyperlipidemia, unspecified: Secondary | ICD-10-CM

## 2021-09-02 DIAGNOSIS — E1169 Type 2 diabetes mellitus with other specified complication: Secondary | ICD-10-CM

## 2021-09-03 NOTE — Telephone Encounter (Signed)
Requested medication (s) are due for refill today: yes to all   Requested medication (s) are on the active medication list: yes  Last refill:  diltiazem: 01/31/21               losartan, carvedilol, atorvastatin,spironolactone, metformin : 08/07/21  Future visit scheduled: yes  Notes to clinic:  overdue lab work    Requested Prescriptions  Pending Prescriptions Disp Refills   diltiazem (CARDIZEM CD) 240 MG 24 hr capsule 90 capsule 1    Sig: Take 1 capsule (240 mg total) by mouth daily.     Cardiovascular: Calcium Channel Blockers 3 Failed - 09/02/2021  2:24 PM      Failed - ALT in normal range and within 360 days    ALT  Date Value Ref Range Status  05/01/2020 22 0 - 32 IU/L Final         Failed - AST in normal range and within 360 days    AST  Date Value Ref Range Status  05/01/2020 18 0 - 40 IU/L Final         Failed - Valid encounter within last 6 months    Recent Outpatient Visits           7 months ago Type 2 diabetes mellitus with morbid obesity ([place])   [place] Karle Plumber B, MD   1 year ago Type 2 diabetes mellitus with morbid obesity ([place])   [place] Karle Plumber B, MD   2 years ago Type 2 diabetes mellitus with diabetic polyneuropathy, without long-term current use of insulin ([place])   [place], Neoma Laming B, MD   2 years ago Type 2 diabetes mellitus with diabetic polyneuropathy, without long-term current use of insulin ([place])   [place], MD   2 years ago Pap smear for cervical cancer screening   [place], MD       Future Appointments             In 1 month Ladell Pier, MD [place] in normal range and within 360 days    Creatinine, Ser  Date Value Ref Range Status  02/06/2021 0.87  0.57 - 1.00 mg/dL Final         Passed - Last BP in normal range    BP Readings from Last 1 Encounters:  01/31/21 135/86         Passed - Last Heart Rate in normal range    Pulse Readings from Last 1 Encounters:  01/31/21 71          losartan (COZAAR) 100 MG tablet 30 tablet 0    Sig: Take 1 tablet (100 mg total) by mouth daily.     Cardiovascular:  Angiotensin Receptor Blockers Failed - 09/02/2021  2:24 PM      Failed - Cr in normal range and within 180 days    Creatinine, Ser  Date Value Ref Range Status  02/06/2021 0.87 0.57 - 1.00 mg/dL Final         Failed - K in normal range and within 180 days    Potassium  Date Value Ref Range Status  02/06/2021 5.0 3.5 - 5.2 mmol/L Final         Failed -  Valid encounter within last 6 months    Recent Outpatient Visits           7 months ago Type 2 diabetes mellitus with morbid obesity ([place])   [place] Ladell Pier, MD   1 year ago Type 2 diabetes mellitus with morbid obesity [facility])   [place] Ladell Pier, MD   2 years ago Type 2 diabetes mellitus with diabetic polyneuropathy, without long-term current use of insulin ([place])   [place], Deborah B, MD   2 years ago Type 2 diabetes mellitus with diabetic polyneuropathy, without long-term current use of insulin ([place])   [place], MD   2 years ago Pap smear for cervical cancer screening   [place], MD       Future Appointments             In 1 month Ladell Pier, MD [place] - Patient is not pregnant      Passed - Last BP in normal range    BP Readings from Last 1 Encounters:  01/31/21 135/86          carvedilol (COREG) 6.25 MG tablet 60 tablet 0    Sig: Take 1 tablet (6.25 mg total) by mouth 2  (two) times daily with a meal.     Cardiovascular: Beta Blockers 3 Failed - 09/02/2021  2:24 PM      Failed - AST in normal range and within 360 days    AST  Date Value Ref Range Status  05/01/2020 18 0 - 40 IU/L Final         Failed - ALT in normal range and within 360 days    ALT  Date Value Ref Range Status  05/01/2020 22 0 - 32 IU/L Final         Failed - Valid encounter within last 6 months    Recent Outpatient Visits           7 months ago Type 2 diabetes mellitus with morbid obesity ([place])   [place] Karle Plumber B, MD   1 year ago Type 2 diabetes mellitus with morbid obesity ([place])   [place] Karle Plumber B, MD   2 years ago Type 2 diabetes mellitus with diabetic polyneuropathy, without long-term current use of insulin ([place])   [place], Neoma Laming B, MD   2 years ago Type 2 diabetes mellitus with diabetic polyneuropathy, without long-term current use of insulin ([place])   [place], MD   2 years ago Pap smear for cervical cancer screening   [place], MD       Future Appointments             In 1 month Ladell Pier, MD [place] - Cr in normal range and within 360 days    Creatinine, Ser  Date Value Ref Range Status  02/06/2021 0.87 0.57 - 1.00 mg/dL Final         Passed - Last BP in  normal range    BP Readings from Last 1 Encounters:  01/31/21 135/86         Passed - Last Heart Rate in normal range    Pulse Readings from Last 1 Encounters:  01/31/21 71          atorvastatin (LIPITOR) 10 MG tablet 30 tablet 0    Sig: Take 1 tablet (10 mg total) by mouth daily.     Cardiovascular:  Antilipid - Statins Failed - 09/02/2021  2:24 PM      Failed - Lipid Panel in normal range within the last 12  months    Cholesterol, Total  Date Value Ref Range Status  05/01/2020 144 100 - 199 mg/dL Final   LDL Chol Calc (NIH)  Date Value Ref Range Status  05/01/2020 65 0 - 99 mg/dL Final   HDL  Date Value Ref Range Status  05/01/2020 62 >39 mg/dL Final   Triglycerides  Date Value Ref Range Status  05/01/2020 90 0 - 149 mg/dL Final         Passed - Patient is not pregnant      Passed - Valid encounter within last 12 months    Recent Outpatient Visits           7 months ago Type 2 diabetes mellitus with morbid obesity ([place])   [place], Deborah B, MD   1 year ago Type 2 diabetes mellitus with morbid obesity ([place])   [place], Neoma Laming B, MD   2 years ago Type 2 diabetes mellitus with diabetic polyneuropathy, without long-term current use of insulin ([place])   [place], Neoma Laming B, MD   2 years ago Type 2 diabetes mellitus with diabetic polyneuropathy, without long-term current use of insulin ([place])   [place], MD   2 years ago Pap smear for cervical cancer screening   [place], MD       Future Appointments             In 1 month Ladell Pier, MD [place]             spironolactone (ALDACTONE) 25 MG tablet 30 tablet 0    Sig: Take 1 tablet (25 mg total) by mouth daily.     Cardiovascular: Diuretics - Aldosterone Antagonist Failed - 09/02/2021  2:24 PM      Failed - Cr in normal range and within 180 days    Creatinine, Ser  Date Value Ref Range Status  02/06/2021 0.87 0.57 - 1.00 mg/dL Final         Failed - K in normal range and within 180 days    Potassium  Date Value Ref Range Status  02/06/2021 5.0 3.5 - 5.2 mmol/L Final         Failed - Na in normal range and within 180 days    Sodium  Date Value Ref  Range Status  02/06/2021 142 134 - 144 mmol/L Final         Failed - eGFR is 30 or above and within 180 days    GFR calc Af Amer  Date Value Ref Range Status  01/14/2019 91 >59 mL/min/1.73 Final   GFR calc non Af Amer  Date Value Ref Range Status  01/14/2019 79 >59 mL/min/1.73 Final   eGFR  Date Value Ref Range Status  02/06/2021 75 >59 mL/min/1.73 Final         Failed - Valid encounter within last 6 months    Recent Outpatient Visits           7 months ago Type 2 diabetes mellitus with morbid obesity ([place])   [place] Ladell Pier, MD   1 year ago Type 2 diabetes mellitus with morbid obesity ([place])   [place], Deborah B, MD   2 years ago Type 2 diabetes mellitus with diabetic polyneuropathy, without long-term current use of insulin ([place])   [place], Deborah B, MD   2 years ago Type 2 diabetes mellitus with diabetic polyneuropathy, without long-term current use of insulin ([place])   [place], MD   2 years ago Pap smear for cervical cancer screening   [place], MD       Future Appointments             In 1 month Ladell Pier, MD [place] - Last BP in normal range    BP Readings from Last 1 Encounters:  01/31/21 135/86          metFORMIN (GLUCOPHAGE) 500 MG tablet 30 tablet 0    Sig: Take 1 tablet (500 mg total) by mouth daily with breakfast.     Endocrinology:  Diabetes - Biguanides Failed - 09/02/2021  2:24 PM      Failed - HBA1C is between 0 and 7.9 and within 180 days    HbA1c, POC (controlled diabetic range)  Date Value Ref Range Status  05/01/2020 6.3 0.0 - 7.0 % Final   Hgb A1c MFr Bld  Date Value Ref Range Status  02/06/2021 6.5 (H) 4.8 - 5.6 % Final    Comment:              Prediabetes: 5.7 - 6.4          Diabetes: >6.4          Glycemic control for adults with diabetes: <7.0          Failed - B12 Level in normal range and within 720 days    Vitamin B-12  Date Value Ref Range Status  11/19/2018 913 232 - 1,245 pg/mL Final         Failed - Valid encounter within last 6 months    Recent Outpatient Visits           7 months ago Type 2 diabetes mellitus with morbid obesity ([place])   [place] Karle Plumber B, MD   1 year ago Type 2 diabetes mellitus with morbid obesity ([place])   [place] Karle Plumber B, MD   2 years ago Type 2 diabetes mellitus with diabetic polyneuropathy, without long-term current use of insulin ([place])   [place], Neoma Laming B, MD   2 years ago Type 2 diabetes mellitus with diabetic polyneuropathy, without long-term current use of insulin ([place])   [place] Ladell Pier, MD   2 years ago Pap smear for cervical cancer screening   [place] Ladell Pier, MD  Future Appointments             In 1 month Ladell Pier, MD [place]            Failed - CBC within normal limits and completed in the last 12 months    WBC  Date Value Ref Range Status  05/01/2020 11.7 (H) 3.4 - 10.8 x10E3/uL Final  09/17/2018 9.8 4.0 - 10.5 K/uL Final   RBC  Date Value Ref Range Status  05/01/2020 5.03 3.77 - 5.28 x10E6/uL Final  09/17/2018 4.74 3.87 - 5.11 MIL/uL Final   Hemoglobin  Date Value Ref Range Status  05/01/2020 14.4 11.1 - 15.9 g/dL Final   Hematocrit  Date Value Ref Range Status  05/01/2020 44.0 34.0 - 46.6 % Final   MCHC  Date Value Ref Range Status  05/01/2020 32.7 31.5 - 35.7 g/dL Final  09/17/2018 33.8 30.0 - 36.0 g/dL Final   [facility]  Date Value Ref Range Status  05/01/2020 28.6 26.6 - 33.0 pg Final   09/17/2018 28.9 26.0 - 34.0 pg Final   MCV  Date Value Ref Range Status  05/01/2020 88 79 - 97 fL Final   No results found for: "PLTCOUNTKUC", "LABPLAT", "POCPLA" RDW  Date Value Ref Range Status  05/01/2020 12.3 11.7 - 15.4 % Final         Passed - Cr in normal range and within 360 days    Creatinine, Ser  Date Value Ref Range Status  02/06/2021 0.87 0.57 - 1.00 mg/dL Final         Passed - eGFR in normal range and within 360 days    GFR calc Af Amer  Date Value Ref Range Status  01/14/2019 91 >59 mL/min/1.73 Final   GFR calc non Af Amer  Date Value Ref Range Status  01/14/2019 79 >59 mL/min/1.73 Final   eGFR  Date Value Ref Range Status  02/06/2021 75 >59 mL/min/1.73 Final

## 2021-09-04 ENCOUNTER — Other Ambulatory Visit (HOSPITAL_COMMUNITY): Payer: Self-pay

## 2021-09-04 MED ORDER — METFORMIN HCL 500 MG PO TABS
500.0000 mg | ORAL_TABLET | Freq: Every day | ORAL | 0 refills | Status: DC
  Filled 2021-09-04: qty 30, 30d supply, fill #0

## 2021-09-04 MED ORDER — SPIRONOLACTONE 25 MG PO TABS
25.0000 mg | ORAL_TABLET | Freq: Every day | ORAL | 0 refills | Status: DC
  Filled 2021-09-04: qty 30, 30d supply, fill #0

## 2021-09-04 MED ORDER — DILTIAZEM HCL ER COATED BEADS 240 MG PO CP24
240.0000 mg | ORAL_CAPSULE | Freq: Every day | ORAL | 0 refills | Status: DC
  Filled 2021-09-04: qty 30, 30d supply, fill #0

## 2021-09-04 MED ORDER — ATORVASTATIN CALCIUM 10 MG PO TABS
10.0000 mg | ORAL_TABLET | Freq: Every day | ORAL | 0 refills | Status: DC
  Filled 2021-09-04: qty 30, 30d supply, fill #0

## 2021-09-04 MED ORDER — CARVEDILOL 6.25 MG PO TABS
6.2500 mg | ORAL_TABLET | Freq: Two times a day (BID) | ORAL | 0 refills | Status: DC
  Filled 2021-09-04: qty 60, 30d supply, fill #0

## 2021-09-04 MED ORDER — LOSARTAN POTASSIUM 100 MG PO TABS
100.0000 mg | ORAL_TABLET | Freq: Every day | ORAL | 0 refills | Status: DC
  Filled 2021-09-04: qty 30, 30d supply, fill #0

## 2021-10-09 ENCOUNTER — Ambulatory Visit
Admission: RE | Admit: 2021-10-09 | Discharge: 2021-10-09 | Disposition: A | Discharge status: Home / Self Care | Payer: No Typology Code available for payment source | Source: Ambulatory Visit | Attending: Obstetrics and Gynecology | Admitting: Obstetrics and Gynecology

## 2021-10-09 ENCOUNTER — Other Ambulatory Visit: Payer: Self-pay | Admitting: Internal Medicine

## 2021-10-09 ENCOUNTER — Other Ambulatory Visit: Payer: Self-pay

## 2021-10-09 ENCOUNTER — Other Ambulatory Visit: Payer: Self-pay | Admitting: Obstetrics and Gynecology

## 2021-10-09 DIAGNOSIS — Z111 Encounter for screening for respiratory tuberculosis: Secondary | ICD-10-CM

## 2021-10-09 DIAGNOSIS — I10 Essential (primary) hypertension: Secondary | ICD-10-CM

## 2021-10-09 DIAGNOSIS — E1169 Type 2 diabetes mellitus with other specified complication: Secondary | ICD-10-CM

## 2021-10-09 MED ORDER — ATORVASTATIN CALCIUM 10 MG PO TABS
10.0000 mg | ORAL_TABLET | Freq: Every day | ORAL | 0 refills | Status: DC
  Filled 2021-10-09: qty 14, 14d supply, fill #0

## 2021-10-09 MED ORDER — DILTIAZEM HCL ER COATED BEADS 240 MG PO CP24
240.0000 mg | ORAL_CAPSULE | Freq: Every day | ORAL | 0 refills | Status: DC
  Filled 2021-10-09: qty 14, 14d supply, fill #0

## 2021-10-09 MED ORDER — CARVEDILOL 6.25 MG PO TABS
6.2500 mg | ORAL_TABLET | Freq: Two times a day (BID) | ORAL | 0 refills | Status: DC
  Filled 2021-10-09: qty 28, 14d supply, fill #0

## 2021-10-09 MED ORDER — METFORMIN HCL 500 MG PO TABS
500.0000 mg | ORAL_TABLET | Freq: Every day | ORAL | 0 refills | Status: DC
  Filled 2021-10-09: qty 14, 14d supply, fill #0

## 2021-10-09 MED ORDER — SPIRONOLACTONE 25 MG PO TABS
25.0000 mg | ORAL_TABLET | Freq: Every day | ORAL | 0 refills | Status: DC
  Filled 2021-10-09: qty 14, 14d supply, fill #0

## 2021-10-09 MED ORDER — LOSARTAN POTASSIUM 100 MG PO TABS
100.0000 mg | ORAL_TABLET | Freq: Every day | ORAL | 0 refills | Status: DC
  Filled 2021-10-09: qty 14, 14d supply, fill #0

## 2021-10-21 ENCOUNTER — Telehealth (HOSPITAL_BASED_OUTPATIENT_CLINIC_OR_DEPARTMENT_OTHER): Payer: BC Managed Care – PPO | Admitting: Internal Medicine

## 2021-10-21 DIAGNOSIS — R918 Other nonspecific abnormal finding of lung field: Secondary | ICD-10-CM

## 2021-10-21 DIAGNOSIS — Z1211 Encounter for screening for malignant neoplasm of colon: Secondary | ICD-10-CM

## 2021-10-21 DIAGNOSIS — E1159 Type 2 diabetes mellitus with other circulatory complications: Secondary | ICD-10-CM | POA: Diagnosis not present

## 2021-10-21 DIAGNOSIS — E1169 Type 2 diabetes mellitus with other specified complication: Secondary | ICD-10-CM

## 2021-10-21 DIAGNOSIS — Z227 Latent tuberculosis: Secondary | ICD-10-CM

## 2021-10-21 DIAGNOSIS — E785 Hyperlipidemia, unspecified: Secondary | ICD-10-CM

## 2021-10-21 DIAGNOSIS — I152 Hypertension secondary to endocrine disorders: Secondary | ICD-10-CM | POA: Diagnosis not present

## 2021-10-21 DIAGNOSIS — Z23 Encounter for immunization: Secondary | ICD-10-CM

## 2021-10-21 DIAGNOSIS — Z7984 Long term (current) use of oral hypoglycemic drugs: Secondary | ICD-10-CM

## 2021-10-21 NOTE — Progress Notes (Signed)
Virtual Visit via Telephone Note  I connected with Shannon Khan on 10/21/2021 at 8:40 AM by telephone and verified that I am speaking with the correct person using two identifiers This was supposed to be a video visit.  We had difficulty connecting so it was changed to a telephone visit.  Location: Patient: home Provider: office  Participants: Myself Patient and daughter Shannon Khan.  Pt speaks english but daughter helps when pt does not understand some questions    I discussed the limitations, risks, security and privacy concerns of performing an evaluation and management service by telephone and the availability of in person appointments. I also discussed with the patient that there may be a patient responsible charge related to this service. The patient expressed understanding and agreed to proceed.   History of Present Illness: patient with history of HTN, obesity, excision of goiter, OA knees, A.fib ( Eliquis d/c by cardiology), new DM with  and neuropathy.   HTN/A.fib: checking BP daily.  Gives range 120/75, today was 114/77 Reports compliance with meds-Cozaar 100 mg daily, diltiazem 240 mg daily, spironolactone 25 mg daily Limits salt in foods No CP/SOB/LE edema/HA/dizziness/palpitations  HL:  taking and tolerating Lipitor 10 mg.  No cramps or muscle aches  DM:  checks BS once a day in a.m before BF. This a.m was 107. Other readings: 103, 121, 116, 112, 95 Compliant with Metformin 500 mg daily. Feels she is doing well with eating habits.  Walks once a day for 20 mins.  Pt applied for Day Care Job.  Required to have TB skin test.  Had pos QuantiFERON test.  CXR done through HD came back abnormal and pt told to review with PCP.  Results in Epic.  Showed: IMPRESSION: Irregular right mid lung nodular opacity. Recommend further evaluation with chest CT.  Plan for latent TB treatment through HD. Pt does not smoke.  No cough, fever or night sweats.  No prior hx of TB.    HM:  never received cologuard test.  Due for flu shot and MMG   Outpatient Encounter Medications as of 10/21/2021  Medication Sig   aspirin EC 81 MG tablet Take 1 tablet (81 mg total) by mouth daily. Swallow whole.   atorvastatin (LIPITOR) 10 MG tablet Take 1 tablet (10 mg total) by mouth daily.   Blood Glucose Monitoring Suppl (ONETOUCH VERIO REFLECT) w/Device KIT 1 kit by Does not apply route 2 (two) times daily.   carvedilol (COREG) 6.25 MG tablet Take 1 tablet (6.25 mg total) by mouth 2 (two) times daily with a meal.   diltiazem (CARDIZEM CD) 240 MG 24 hr capsule Take 1 capsule (240 mg total) by mouth daily.   glucose blood (ONETOUCH VERIO) test strip Use as instructed   losartan (COZAAR) 100 MG tablet Take 1 tablet (100 mg total) by mouth daily.   metFORMIN (GLUCOPHAGE) 500 MG tablet Take 1 tablet (500 mg total) by mouth daily with breakfast.   OneTouch Delica Lancets 20F MISC Use to check blood sugar twice daily.   spironolactone (ALDACTONE) 25 MG tablet Take 1 tablet (25 mg total) by mouth daily.   No facility-administered encounter medications on file as of 10/21/2021.      Observations/Objective: No direct observation done as this was a telephone visit.  Assessment and Plan: 1. Type 2 diabetes mellitus with morbid obesity ([place]) Reported blood sugar readings are at goal. Continue metformin 500 mg daily Continue healthy eating habits and regular exercise. - CBC; Future - Comprehensive metabolic panel;  Future - Hemoglobin A1c; Future - Microalbumin / creatinine urine ratio; Future  2. Hypertension associated with diabetes ([place]) Reported home blood pressure readings are at goal.  Continue spironolactone 25 mg daily, diltiazem 240 mg daily, Cozaar 100 mg daily  3. Hyperlipidemia associated with type 2 diabetes mellitus (HCC) Continue atorvastatin.  She will come to the lab to have lipid profile drawn - Lipid panel; Future  4. TB lung, latent Followed by the health department.   Patient daughter tells me that they plan to prescribe prophylactic therapy for her.  5. Abnormality of lung on CXR - CT Chest W Contrast; Future  6. Screening for colon cancer We tried to prescribe Cologuard for her several times.  Patient states she never received it.  She is agreeable to doing the fit test instead. - Fecal occult blood, imunochemical([facility]); Future  7. Need for immunization against influenza Plans to come to the lab on Wednesday to have blood test done.  We will schedule her to get flu vaccine at the same time.  Follow Up Instructions: 4 mths in person   I discussed the assessment and treatment plan with the patient. The patient was provided an opportunity to ask questions and all were answered. The patient agreed with the plan and demonstrated an understanding of the instructions.   The patient was advised to call back or seek an in-person evaluation if the symptoms worsen or if the condition fails to improve as anticipated.  I  Spent 20 minutes on this telephone encounter  This note has been created with Surveyor, quantity. Any transcriptional errors are unintentional.  Karle Plumber, MD

## 2021-10-22 ENCOUNTER — Other Ambulatory Visit: Payer: Self-pay | Admitting: Internal Medicine

## 2021-10-22 DIAGNOSIS — E785 Hyperlipidemia, unspecified: Secondary | ICD-10-CM

## 2021-10-22 DIAGNOSIS — E1169 Type 2 diabetes mellitus with other specified complication: Secondary | ICD-10-CM

## 2021-10-22 DIAGNOSIS — I10 Essential (primary) hypertension: Secondary | ICD-10-CM

## 2021-10-22 MED ORDER — SPIRONOLACTONE 25 MG PO TABS
25.0000 mg | ORAL_TABLET | Freq: Every day | ORAL | 5 refills | Status: DC
  Filled 2021-10-22: qty 30, 30d supply, fill #0

## 2021-10-22 NOTE — Telephone Encounter (Signed)
Requested medication (s) are due for refill today: yes  Requested medication (s) are on the active medication list: yes  Last refill:  Metformin 10/09/21 #14 with 0 RF, Lipitor 10/09/21 #14 with 0 RF, Coreg 10/09/21 #28 with 0 RF, Cozaar 10/09/21 #14 with 0 RF, Cardizem 10/09/21 #14 with 0 RF  Future visit scheduled: No, seen yesterday  Notes to clinic:  Failed protocol of labs within 12 months, ALT, AST, CBC from 04/2020, no upcoming appt, (labs ordered) please assess.       Requested Prescriptions  Pending Prescriptions Disp Refills   diltiazem (CARDIZEM CD) 240 MG 24 hr capsule 14 capsule 0    Sig: Take 1 capsule (240 mg total) by mouth daily.     Cardiovascular: Calcium Channel Blockers 3 Failed - 10/22/2021  3:32 PM      Failed - ALT in normal range and within 360 days    ALT  Date Value Ref Range Status  05/01/2020 22 0 - 32 IU/L Final         Failed - AST in normal range and within 360 days    AST  Date Value Ref Range Status  05/01/2020 18 0 - 40 IU/L Final         Passed - Cr in normal range and within 360 days    Creatinine, Ser  Date Value Ref Range Status  02/06/2021 0.87 0.57 - 1.00 mg/dL Final         Passed - Last BP in normal range    BP Readings from Last 1 Encounters:  01/31/21 135/86         Passed - Last Heart Rate in normal range    Pulse Readings from Last 1 Encounters:  01/31/21 71         Passed - Valid encounter within last 6 months    Recent Outpatient Visits           Yesterday Type 2 diabetes mellitus with morbid obesity ([place])   [place], Neoma Laming B, MD   8 months ago Type 2 diabetes mellitus with morbid obesity [facility])   [place] Karle Plumber B, MD   1 year ago Type 2 diabetes mellitus with morbid obesity [facility])   [place] Karle Plumber B, MD   2 years ago Type 2 diabetes mellitus with diabetic polyneuropathy, without long-term  current use of insulin ([place])   [place] Karle Plumber B, MD   2 years ago Type 2 diabetes mellitus with diabetic polyneuropathy, without long-term current use of insulin ([place])   [place], Dalbert Batman, MD               losartan (COZAAR) 100 MG tablet 14 tablet 0    Sig: Take 1 tablet (100 mg total) by mouth daily.     Cardiovascular:  Angiotensin Receptor Blockers Failed - 10/22/2021  3:32 PM      Failed - Cr in normal range and within 180 days    Creatinine, Ser  Date Value Ref Range Status  02/06/2021 0.87 0.57 - 1.00 mg/dL Final         Failed - K in normal range and within 180 days    Potassium  Date Value Ref Range Status  02/06/2021 5.0 3.5 - 5.2 mmol/L Final         Passed - Patient is not pregnant  Passed - Last BP in normal range    BP Readings from Last 1 Encounters:  01/31/21 135/86         Passed - Valid encounter within last 6 months    Recent Outpatient Visits           Yesterday Type 2 diabetes mellitus with morbid obesity ([place])   [place] Ladell Pier, MD   8 months ago Type 2 diabetes mellitus with morbid obesity ([place])   [place], Deborah B, MD   1 year ago Type 2 diabetes mellitus with morbid obesity ([place])   [place], Deborah B, MD   2 years ago Type 2 diabetes mellitus with diabetic polyneuropathy, without long-term current use of insulin ([place])   [place], Deborah B, MD   2 years ago Type 2 diabetes mellitus with diabetic polyneuropathy, without long-term current use of insulin ([place])   [place] Ladell Pier, MD               carvedilol (COREG) 6.25 MG tablet 28 tablet 0    Sig: Take 1 tablet (6.25 mg total) by mouth 2 (two) times daily with a meal.      Cardiovascular: Beta Blockers 3 Failed - 10/22/2021  3:32 PM      Failed - AST in normal range and within 360 days    AST  Date Value Ref Range Status  05/01/2020 18 0 - 40 IU/L Final         Failed - ALT in normal range and within 360 days    ALT  Date Value Ref Range Status  05/01/2020 22 0 - 32 IU/L Final         Passed - Cr in normal range and within 360 days    Creatinine, Ser  Date Value Ref Range Status  02/06/2021 0.87 0.57 - 1.00 mg/dL Final         Passed - Last BP in normal range    BP Readings from Last 1 Encounters:  01/31/21 135/86         Passed - Last Heart Rate in normal range    Pulse Readings from Last 1 Encounters:  01/31/21 71         Passed - Valid encounter within last 6 months    Recent Outpatient Visits           Yesterday Type 2 diabetes mellitus with morbid obesity ([place])   [place], Neoma Laming B, MD   8 months ago Type 2 diabetes mellitus with morbid obesity [facility])   [place] Karle Plumber B, MD   1 year ago Type 2 diabetes mellitus with morbid obesity [facility])   [place] Karle Plumber B, MD   2 years ago Type 2 diabetes mellitus with diabetic polyneuropathy, without long-term current use of insulin ([place])   [place], Neoma Laming B, MD   2 years ago Type 2 diabetes mellitus with diabetic polyneuropathy, without long-term current use of insulin ([place])   [place], Neoma Laming B, MD               atorvastatin (LIPITOR) 10 MG tablet 14 tablet 0    Sig: Take 1 tablet (10 mg total) by mouth  daily.     Cardiovascular:  Antilipid - Statins Failed - 10/22/2021  3:32 PM      Failed - Lipid Panel in normal range within the last 12 months    Cholesterol, Total  Date Value Ref Range Status  05/01/2020 144 100 - 199 mg/dL Final   LDL Chol Calc (NIH)  Date Value Ref  Range Status  05/01/2020 65 0 - 99 mg/dL Final   HDL  Date Value Ref Range Status  05/01/2020 62 >39 mg/dL Final   Triglycerides  Date Value Ref Range Status  05/01/2020 90 0 - 149 mg/dL Final         Passed - Patient is not pregnant      Passed - Valid encounter within last 12 months    Recent Outpatient Visits           Yesterday Type 2 diabetes mellitus with morbid obesity ([place])   [place], Neoma Laming B, MD   8 months ago Type 2 diabetes mellitus with morbid obesity ([place])   [place] [facility] Lad[facility] Pier, MD   1 year ago Type 2 diabetes mellitus with morbid obesity ([place])   [place] [facility] Ladell Pier, MD   2 years ago Type 2 diabetes mellitus with diabetic polyneuropathy, without long-term current use of insulin ([place])   [place] Karle Plumber B, MD   2 years ago Type 2 diabetes mellitus with diabetic polyneuropathy, without long-term current use of insulin ([place])   [place], Dalbert Batman, MD               metFORMIN (GLUCOPHAGE) 500 MG tablet 14 tablet 0    Sig: Take 1 tablet (500 mg total) by mouth daily with breakfast.     Endocrinology:  Diabetes - Biguanides Failed - 10/22/2021  3:32 PM      Failed - HBA1C is between 0 and 7.9 and within 180 days    HbA1c, POC (controlled diabetic range)  Date Value Ref Range Status  05/01/2020 6.3 0.0 - 7.0 % Final   Hgb A1c MFr Bld  Date Value Ref Range Status  02/06/2021 6.5 (H) 4.8 - 5.6 % Final    Comment:             Prediabetes: 5.7 - 6.4          Diabetes: >6.4          Glycemic control for adults with diabetes: <7.0          Failed - B12 Level in normal range and within 720 days    Vitamin B-12  Date Value Ref Range Status  11/19/2018 913 232 - 1,245 pg/mL Final         Failed - CBC within normal limits and completed in the last 12  months    WBC  Date Value Ref Range Status  05/01/2020 11.7 (H) 3.4 - 10.8 x10E3/uL Final  09/17/2018 9.8 4.0 - 10.5 K/uL Final   RBC  Date Value Ref Range Status  05/01/2020 5.03 3.77 - 5.28 x10E6/uL Final  09/17/2018 4.74 3.87 - 5.11 MIL/uL Final   Hemoglobin  Date Value Ref Range Status  05/01/2020 14.4 11.1 - 15.9 g/dL Final   Hematocrit  Date Value Ref Range Status  05/01/2020 44.0 34.0 - 46.6 % Final   MCHC  Date Value Ref Range Status  05/01/2020 32.7 31.5 -  35.7 g/dL Final  09/17/2018 33.8 30.0 - 36.0 g/dL Final   [facility]  Date Value Ref Range Status  05/01/2020 28.6 26.6 - 33.0 pg Final  09/17/2018 28.9 26.0 - 34.0 pg Final   MCV  Date Value Ref Range Status  05/01/2020 88 79 - 97 fL Final   No results found for: "PLTCOUNTKUC", "LABPLAT", "POCPLA" RDW  Date Value Ref Range Status  05/01/2020 12.3 11.7 - 15.4 % Final         Passed - Cr in normal range and within 360 days    Creatinine, Ser  Date Value Ref Range Status  02/06/2021 0.87 0.57 - 1.00 mg/dL Final         Passed - eGFR in normal range and within 360 days    GFR calc Af Amer  Date Value Ref Range Status  01/14/2019 91 >59 mL/min/1.73 Final   GFR calc non Af Amer  Date Value Ref Range Status  01/14/2019 79 >59 mL/min/1.73 Final   eGFR  Date Value Ref Range Status  02/06/2021 75 >59 mL/min/1.73 Final         Passed - Valid encounter within last 6 months    Recent Outpatient Visits           Yesterday Type 2 diabetes mellitus with morbid obesity ([place])   [place], Neoma Laming B, MD   8 months ago Type 2 diabetes mellitus with morbid obesity [facility])   [place] Ladell Pier, MD   1 year ago Type 2 diabetes mellitus with morbid obesity [facility])   [place] Karle Plumber B, MD   2 years ago Type 2 diabetes mellitus with diabetic polyneuropathy, without long-term current use of insulin  ([place])   [place], Neoma Laming B, MD   2 years ago Type 2 diabetes mellitus with diabetic polyneuropathy, without long-term current use of insulin ([place])   [place] Ladell Pier, MD              Signed Prescriptions Disp Refills   spironolactone (ALDACTONE) 25 MG tablet 30 tablet 5    Sig: Take 1 tablet (25 mg total) by mouth daily.     Cardiovascular: Diuretics - Aldosterone Antagonist Failed - 10/22/2021  3:32 PM      Failed - Cr in normal range and within 180 days    Creatinine, Ser  Date Value Ref Range Status  02/06/2021 0.87 0.57 - 1.00 mg/dL Final         Failed - K in normal range and within 180 days    Potassium  Date Value Ref Range Status  02/06/2021 5.0 3.5 - 5.2 mmol/L Final         Failed - Na in normal range and within 180 days    Sodium  Date Value Ref Range Status  02/06/2021 142 134 - 144 mmol/L Final         Failed - eGFR is 30 or above and within 180 days    GFR calc Af Amer  Date Value Ref Range Status  01/14/2019 91 >59 mL/min/1.73 Final   GFR calc non Af Amer  Date Value Ref Range Status  01/14/2019 79 >59 mL/min/1.73 Final   eGFR  Date Value Ref Range Status  02/06/2021 75 >59 mL/min/1.73 Final         Passed - Last BP in normal range  BP Readings from Last 1 Encounters:  01/31/21 135/86         Passed - Valid encounter within last 6 months    Recent Outpatient Visits           Yesterday Type 2 diabetes mellitus with morbid obesity [facility])   [place] Karle Plumber B, MD   8 months ago Type 2 diabetes mellitus with morbid obesity [facility])   [place] Ladell Pier, MD   1 year ago Type 2 diabetes mellitus with morbid obesity [facility])   [place] Karle Plumber B, MD   2 years ago Type 2 diabetes mellitus with diabetic polyneuropathy, without long-term current  use of insulin [facility])   [place] Karle Plumber B, MD   2 years ago Type 2 diabetes mellitus with diabetic polyneuropathy, without long-term current use of insulin [facility])   [place] [facility] [facility] Ladell Pier, MD

## 2021-10-22 NOTE — Telephone Encounter (Signed)
Requested Prescriptions  Pending Prescriptions Disp Refills  . diltiazem (CARDIZEM CD) 240 MG 24 hr capsule 14 capsule 0    Sig: Take 1 capsule (240 mg total) by mouth daily.     Cardiovascular: Calcium Channel Blockers 3 Failed - 10/22/2021  3:32 PM      Failed - ALT in normal range and within 360 days    ALT  Date Value Ref Range Status  05/01/2020 22 0 - 32 IU/L Final         Failed - AST in normal range and within 360 days    AST  Date Value Ref Range Status  05/01/2020 18 0 - 40 IU/L Final         Passed - Cr in normal range and within 360 days    Creatinine, Ser  Date Value Ref Range Status  02/06/2021 0.87 0.57 - 1.00 mg/dL Final         Passed - Last BP in normal range    BP Readings from Last 1 Encounters:  01/31/21 135/86         Passed - Last Heart Rate in normal range    Pulse Readings from Last 1 Encounters:  01/31/21 71         Passed - Valid encounter within last 6 months    Recent Outpatient Visits          Yesterday Type 2 diabetes mellitus with morbid obesity ([place])   [place], Neoma Laming B, MD   8 months ago Type 2 diabetes mellitus with morbid obesity [facility])   [place] Karle Plumber B, MD   1 year ago Type 2 diabetes mellitus with morbid obesity [facility])   [place] Karle Plumber B, MD   2 years ago Type 2 diabetes mellitus with diabetic polyneuropathy, without long-term current use of insulin ([place])   [place] Karle Plumber B, MD   2 years ago Type 2 diabetes mellitus with diabetic polyneuropathy, without long-term current use of insulin ([place])   [place], Neoma Laming B, MD             . losartan (COZAAR) 100 MG tablet 14 tablet 0    Sig: Take 1 tablet (100 mg total) by mouth daily.     Cardiovascular:  Angiotensin Receptor Blockers Failed - 10/22/2021  3:32 PM       Failed - Cr in normal range and within 180 days    Creatinine, Ser  Date Value Ref Range Status  02/06/2021 0.87 0.57 - 1.00 mg/dL Final         Failed - K in normal range and within 180 days    Potassium  Date Value Ref Range Status  02/06/2021 5.0 3.5 - 5.2 mmol/L Final         Passed - Patient is not pregnant      Passed - Last BP in normal range    BP Readings from Last 1 Encounters:  01/31/21 135/86         Passed - Valid encounter within last 6 months    Recent Outpatient Visits          Yesterday Type 2 diabetes mellitus with morbid obesity [facility])   [place] Karle Plumber B, MD   8 months ago Type 2 diabetes mellitus with morbid obesity ([place])   [place], Dalbert Batman, MD   1 year ago Type 2 diabetes mellitus with morbid obesity ([place])   [place] Ladell Pier, MD   2 years ago Type 2 diabetes mellitus with diabetic polyneuropathy, without long-term current use of insulin ([place])   [place] Ladell Pier, MD   2 years ago Type 2 diabetes mellitus with diabetic polyneuropathy, without long-term current use of insulin ([place])   [place] Karle Plumber B, MD             . carvedilol (COREG) 6.25 MG tablet 28 tablet 0    Sig: Take 1 tablet (6.25 mg total) by mouth 2 (two) times daily with a meal.     Cardiovascular: Beta Blockers 3 Failed - 10/22/2021  3:32 PM      Failed - AST in normal range and within 360 days    AST  Date Value Ref Range Status  05/01/2020 18 0 - 40 IU/L Final         Failed - ALT in normal range and within 360 days    ALT  Date Value Ref Range Status  05/01/2020 22 0 - 32 IU/L Final         Passed - Cr in normal range and within 360 days    Creatinine, Ser  Date Value Ref Range Status  02/06/2021 0.87 0.57 - 1.00 mg/dL Final         Passed - Last BP in  normal range    BP Readings from Last 1 Encounters:  01/31/21 135/86         Passed - Last Heart Rate in normal range    Pulse Readings from Last 1 Encounters:  01/31/21 71         Passed - Valid encounter within last 6 months    Recent Outpatient Visits          Yesterday Type 2 diabetes mellitus with morbid obesity ([place])   [place], Neoma Laming B, MD   8 months ago Type 2 diabetes mellitus with morbid obesity [facility])   [place] Karle Plumber B, MD   1 year ago Type 2 diabetes mellitus with morbid obesity [facility])   [place] Karle Plumber B, MD   2 years ago Type 2 diabetes mellitus with diabetic polyneuropathy, without long-term current use of insulin ([place])   [place] Karle Plumber B, MD   2 years ago Type 2 diabetes mellitus with diabetic polyneuropathy, without long-term current use of insulin ([place])   [place], Neoma Laming B, MD             . atorvastatin (LIPITOR) 10 MG tablet 14 tablet 0    Sig: Take 1 tablet (10 mg total) by mouth daily.     Cardiovascular:  Antilipid - Statins Failed - 10/22/2021  3:32 PM      Failed - Lipid Panel in normal range within the last 12 months    Cholesterol, Total  Date Value Ref Range Status  05/01/2020 144 100 - 199 mg/dL Final   LDL Chol Calc (NIH)  Date Value Ref Range Status  05/01/2020 65 0 - 99 mg/dL Final   HDL  Date Value Ref Range Status  05/01/2020 62 >39 mg/dL Final   Triglycerides  Date  Value Ref Range Status  05/01/2020 90 0 - 149 mg/dL Final         Passed - Patient is not pregnant      Passed - Valid encounter within last 12 months    Recent Outpatient Visits          Yesterday Type 2 diabetes mellitus with morbid obesity ([place])   [place], Neoma Laming B, MD   8 months ago Type 2 diabetes  mellitus with morbid obesity [facility])   [place] Karle Plumber B, MD   1 year ago Type 2 diabetes mellitus with morbid obesity [facility])   [place] Karle Plumber B, MD   2 years ago Type 2 diabetes mellitus with diabetic polyneuropathy, without long-term current use of insulin ([place])   [place] Karle Plumber B, MD   2 years ago Type 2 diabetes mellitus with diabetic polyneuropathy, without long-term current use of insulin ([place])   [place] Karle Plumber B, MD             . spironolactone (ALDACTONE) 25 MG tablet 30 tablet 5    Sig: Take 1 tablet (25 mg total) by mouth daily.     Cardiovascular: Diuretics - Aldosterone Antagonist Failed - 10/22/2021  3:32 PM      Failed - Cr in normal range and within 180 days    Creatinine, Ser  Date Value Ref Range Status  02/06/2021 0.87 0.57 - 1.00 mg/dL Final         Failed - K in normal range and within 180 days    Potassium  Date Value Ref Range Status  02/06/2021 5.0 3.5 - 5.2 mmol/L Final         Failed - Na in normal range and within 180 days    Sodium  Date Value Ref Range Status  02/06/2021 142 134 - 144 mmol/L Final         Failed - eGFR is 30 or above and within 180 days    GFR calc Af Amer  Date Value Ref Range Status  01/14/2019 91 >59 mL/min/1.73 Final   GFR calc non Af Amer  Date Value Ref Range Status  01/14/2019 79 >59 mL/min/1.73 Final   eGFR  Date Value Ref Range Status  02/06/2021 75 >59 mL/min/1.73 Final         Passed - Last BP in normal range    BP Readings from Last 1 Encounters:  01/31/21 135/86         Passed - Valid encounter within last 6 months    Recent Outpatient Visits          Yesterday Type 2 diabetes mellitus with morbid obesity ([place])   [place] Karle Plumber B, MD   8 months ago Type 2 diabetes mellitus with morbid  obesity [facility])   [place] Ladell Pier, MD   1 year ago Type 2 diabetes mellitus with morbid obesity [facility])   [place] Karle Plumber B, MD   2 years ago Type 2 diabetes mellitus with diabetic polyneuropathy, without long-term current use of insulin ([place])   [place] [employer], Neoma Laming B, MD   2 years ago Type 2 diabetes mellitus with diabetic polyneuropathy, without long-term current use of insulin ([place])   [place]  [facility] Wynetta Emery, Dalbert Batman, MD             . metFORMIN (GLUCOPHAGE) 500 MG tablet 14 tablet 0    Sig: Take 1 tablet (500 mg total) by mouth daily with breakfast.     Endocrinology:  Diabetes - Biguanides Failed - 10/22/2021  3:32 PM      Failed - HBA1C is between 0 and 7.9 and within 180 days    HbA1c, POC (controlled diabetic range)  Date Value Ref Range Status  05/01/2020 6.3 0.0 - 7.0 % Final   Hgb A1c MFr Bld  Date Value Ref Range Status  02/06/2021 6.5 (H) 4.8 - 5.6 % Final    Comment:             Prediabetes: 5.7 - 6.4          Diabetes: >6.4          Glycemic control for adults with diabetes: <7.0          Failed - B12 Level in normal range and within 720 days    Vitamin B-12  Date Value Ref Range Status  11/19/2018 913 232 - 1,245 pg/mL Final         Failed - CBC within normal limits and completed in the last 12 months    WBC  Date Value Ref Range Status  05/01/2020 11.7 (H) 3.4 - 10.8 x10E3/uL Final  09/17/2018 9.8 4.0 - 10.5 K/uL Final   RBC  Date Value Ref Range Status  05/01/2020 5.03 3.77 - 5.28 x10E6/uL Final  09/17/2018 4.74 3.87 - 5.11 MIL/uL Final   Hemoglobin  Date Value Ref Range Status  05/01/2020 14.4 11.1 - 15.9 g/dL Final   Hematocrit  Date Value Ref Range Status  05/01/2020 44.0 34.0 - 46.6 % Final   MCHC  Date Value Ref Range Status  05/01/2020 32.7 31.5 - 35.7 g/dL Final  09/17/2018 33.8 30.0 -  36.0 g/dL Final   [facility]  Date Value Ref Range Status  05/01/2020 28.6 26.6 - 33.0 pg Final  09/17/2018 28.9 26.0 - 34.0 pg Final   MCV  Date Value Ref Range Status  05/01/2020 88 79 - 97 fL Final   No results found for: "PLTCOUNTKUC", "LABPLAT", "POCPLA" RDW  Date Value Ref Range Status  05/01/2020 12.3 11.7 - 15.4 % Final         Passed - Cr in normal range and within 360 days    Creatinine, Ser  Date Value Ref Range Status  02/06/2021 0.87 0.57 - 1.00 mg/dL Final         Passed - eGFR in normal range and within 360 days    GFR calc Af Amer  Date Value Ref Range Status  01/14/2019 91 >59 mL/min/1.73 Final   GFR calc non Af Amer  Date Value Ref Range Status  01/14/2019 79 >59 mL/min/1.73 Final   eGFR  Date Value Ref Range Status  02/06/2021 75 >59 mL/min/1.73 Final         Passed - Valid encounter within last 6 months    Recent Outpatient Visits          Yesterday Type 2 diabetes mellitus with morbid obesity ([place])   [place] Karle Plumber B, MD   8 months ago Type 2 diabetes mellitus with morbid obesity [facility])   [place] Ladell Pier, MD   1 year ago Type 2 diabetes mellitus with morbid obesity (  [facility])   [place], Neoma Laming B, MD   2 years ago Type 2 diabetes mellitus with diabetic polyneuropathy, without long-term current use of insulin [facility])   [place] Karle Plumber B, MD   2 years ago Type 2 diabetes mellitus with diabetic polyneuropathy, without long-term current use of insulin [facility])    [facility] Ladell Pier, MD

## 2021-10-23 ENCOUNTER — Ambulatory Visit: Payer: BC Managed Care – PPO | Attending: Internal Medicine

## 2021-10-23 ENCOUNTER — Other Ambulatory Visit: Payer: Self-pay

## 2021-10-23 ENCOUNTER — Ambulatory Visit: Payer: BC Managed Care – PPO

## 2021-10-23 ENCOUNTER — Other Ambulatory Visit: Payer: Self-pay | Admitting: Internal Medicine

## 2021-10-23 ENCOUNTER — Telehealth: Payer: Self-pay

## 2021-10-23 DIAGNOSIS — Z23 Encounter for immunization: Secondary | ICD-10-CM

## 2021-10-23 DIAGNOSIS — I10 Essential (primary) hypertension: Secondary | ICD-10-CM

## 2021-10-23 DIAGNOSIS — E1169 Type 2 diabetes mellitus with other specified complication: Secondary | ICD-10-CM

## 2021-10-23 DIAGNOSIS — E785 Hyperlipidemia, unspecified: Secondary | ICD-10-CM | POA: Diagnosis not present

## 2021-10-23 MED ORDER — METFORMIN HCL 500 MG PO TABS
500.0000 mg | ORAL_TABLET | Freq: Every day | ORAL | 1 refills | Status: DC
  Filled 2021-10-23: qty 30, 30d supply, fill #0
  Filled 2021-11-19: qty 30, 30d supply, fill #1
  Filled 2021-12-22 – 2021-12-26 (×2): qty 30, 30d supply, fill #2

## 2021-10-23 MED ORDER — ATORVASTATIN CALCIUM 10 MG PO TABS
10.0000 mg | ORAL_TABLET | Freq: Every day | ORAL | 1 refills | Status: DC
  Filled 2021-10-23: qty 30, 30d supply, fill #0
  Filled 2021-11-19: qty 30, 30d supply, fill #1
  Filled 2021-12-22 – 2021-12-26 (×2): qty 30, 30d supply, fill #2

## 2021-10-23 MED ORDER — SPIRONOLACTONE 25 MG PO TABS
25.0000 mg | ORAL_TABLET | Freq: Every day | ORAL | 1 refills | Status: DC
  Filled 2021-10-23: qty 90, 90d supply, fill #0
  Filled 2021-11-19: qty 30, 30d supply, fill #0
  Filled 2021-12-22 – 2021-12-26 (×2): qty 30, 30d supply, fill #1

## 2021-10-23 MED ORDER — LOSARTAN POTASSIUM 100 MG PO TABS
100.0000 mg | ORAL_TABLET | Freq: Every day | ORAL | 1 refills | Status: DC
  Filled 2021-10-23: qty 30, 30d supply, fill #0
  Filled 2021-11-19: qty 30, 30d supply, fill #1
  Filled 2021-12-22 – 2021-12-26 (×2): qty 30, 30d supply, fill #2

## 2021-10-23 MED ORDER — DILTIAZEM HCL ER COATED BEADS 240 MG PO CP24
240.0000 mg | ORAL_CAPSULE | Freq: Every day | ORAL | 0 refills | Status: DC
  Filled 2021-10-23: qty 14, 14d supply, fill #0

## 2021-10-23 MED ORDER — CARVEDILOL 6.25 MG PO TABS
6.2500 mg | ORAL_TABLET | Freq: Two times a day (BID) | ORAL | 1 refills | Status: DC
  Filled 2021-10-23: qty 60, 30d supply, fill #0
  Filled 2021-11-19: qty 60, 30d supply, fill #1
  Filled 2021-12-22 – 2021-12-26 (×2): qty 60, 30d supply, fill #2

## 2021-10-23 NOTE — Telephone Encounter (Signed)
I spoke to patient's daughter, [name] and informed her that Dr Wynetta Emery would be sending refills of patient's meds to the pharmacy today

## 2021-10-23 NOTE — Telephone Encounter (Signed)
noted 

## 2021-10-23 NOTE — Telephone Encounter (Signed)
Call received from his patient's daughter who would like to know why her mother did not receive a full month refill of her meds.   She only received 1/2 month supply and she had a video visit with Dr Wynetta Emery  on Mon.

## 2021-10-24 ENCOUNTER — Other Ambulatory Visit: Payer: Self-pay | Admitting: Internal Medicine

## 2021-10-24 ENCOUNTER — Other Ambulatory Visit: Payer: Self-pay

## 2021-10-24 ENCOUNTER — Other Ambulatory Visit (HOSPITAL_COMMUNITY): Payer: Self-pay

## 2021-10-24 DIAGNOSIS — Z1211 Encounter for screening for malignant neoplasm of colon: Secondary | ICD-10-CM | POA: Diagnosis not present

## 2021-10-24 DIAGNOSIS — Z1231 Encounter for screening mammogram for malignant neoplasm of breast: Secondary | ICD-10-CM

## 2021-10-24 LAB — HEMOGLOBIN A1C
Est. average glucose Bld gHb Est-mCnc: 134 mg/dL
Hgb A1c MFr Bld: 6.3 % — ABNORMAL HIGH (ref 4.8–5.6)

## 2021-10-24 LAB — COMPREHENSIVE METABOLIC PANEL
ALT: 31 IU/L (ref 0–32)
AST: 28 IU/L (ref 0–40)
Albumin/Globulin Ratio: 1.5 (ref 1.2–2.2)
Albumin: 4.8 g/dL (ref 3.9–4.9)
Alkaline Phosphatase: 114 IU/L (ref 44–121)
BUN/Creatinine Ratio: 18 (ref 12–28)
BUN: 15 mg/dL (ref 8–27)
Bilirubin Total: 0.3 mg/dL (ref 0.0–1.2)
CO2: 24 mmol/L (ref 20–29)
Calcium: 10.8 mg/dL — ABNORMAL HIGH (ref 8.7–10.3)
Chloride: 101 mmol/L (ref 96–106)
Creatinine, Ser: 0.84 mg/dL (ref 0.57–1.00)
Globulin, Total: 3.1 g/dL (ref 1.5–4.5)
Glucose: 110 mg/dL — ABNORMAL HIGH (ref 70–99)
Potassium: 4.7 mmol/L (ref 3.5–5.2)
Sodium: 140 mmol/L (ref 134–144)
Total Protein: 7.9 g/dL (ref 6.0–8.5)
eGFR: 78 mL/min/{1.73_m2} (ref >59)

## 2021-10-24 LAB — LIPID PANEL
Chol/HDL Ratio: 2.4 ratio (ref 0.0–4.4)
Cholesterol, Total: 154 mg/dL (ref 100–199)
HDL: 63 mg/dL (ref >39)
LDL Chol Calc (NIH): 81 mg/dL (ref 0–99)
Triglycerides: 45 mg/dL (ref 0–149)
VLDL Cholesterol Cal: 10 mg/dL (ref 5–40)

## 2021-10-24 LAB — CBC
Hematocrit: 46.1 % (ref 34.0–46.6)
Hemoglobin: 15.3 g/dL (ref 11.1–15.9)
MCH: 29.1 pg (ref 26.6–33.0)
MCHC: 33.2 g/dL (ref 31.5–35.7)
MCV: 88 fL (ref 79–97)
Platelets: 193 10*3/uL (ref 150–450)
RBC: 5.25 x10E6/uL (ref 3.77–5.28)
RDW: 12.3 % (ref 11.7–15.4)
WBC: 6.8 10*3/uL (ref 3.4–10.8)

## 2021-10-24 LAB — MICROALBUMIN / CREATININE URINE RATIO
Creatinine, Urine: 83.7 mg/dL
Microalb/Creat Ratio: 19 mg/g creat (ref 0–29)
Microalbumin, Urine: 15.9 ug/mL

## 2021-10-25 LAB — FECAL OCCULT BLOOD, IMMUNOCHEMICAL: Fecal Occult Bld: NEGATIVE

## 2021-11-02 ENCOUNTER — Other Ambulatory Visit: Payer: Self-pay | Admitting: Internal Medicine

## 2021-11-02 ENCOUNTER — Encounter: Payer: Self-pay | Admitting: Internal Medicine

## 2021-11-02 DIAGNOSIS — I10 Essential (primary) hypertension: Secondary | ICD-10-CM

## 2021-11-04 ENCOUNTER — Other Ambulatory Visit (HOSPITAL_COMMUNITY): Payer: Self-pay

## 2021-11-04 MED ORDER — DILTIAZEM HCL ER COATED BEADS 240 MG PO CP24
240.0000 mg | ORAL_CAPSULE | Freq: Every day | ORAL | 1 refills | Status: DC
  Filled 2021-11-04: qty 30, 30d supply, fill #0
  Filled 2021-12-05: qty 30, 30d supply, fill #1

## 2021-11-06 ENCOUNTER — Other Ambulatory Visit: Payer: Self-pay

## 2021-11-18 ENCOUNTER — Inpatient Hospital Stay: Admission: RE | Admit: 2021-11-18 | Payer: BC Managed Care – PPO | Source: Ambulatory Visit

## 2021-11-20 ENCOUNTER — Other Ambulatory Visit (HOSPITAL_COMMUNITY): Payer: Self-pay

## 2021-12-05 ENCOUNTER — Other Ambulatory Visit (HOSPITAL_COMMUNITY): Payer: Self-pay

## 2021-12-06 ENCOUNTER — Ambulatory Visit (HOSPITAL_COMMUNITY)
Admission: EM | Admit: 2021-12-06 | Discharge: 2021-12-06 | Disposition: A | Discharge status: Home / Self Care | Payer: BC Managed Care – PPO | Attending: Physician Assistant | Admitting: Physician Assistant

## 2021-12-06 ENCOUNTER — Ambulatory Visit (INDEPENDENT_AMBULATORY_CARE_PROVIDER_SITE_OTHER): Payer: BC Managed Care – PPO

## 2021-12-06 ENCOUNTER — Encounter (HOSPITAL_COMMUNITY): Payer: Self-pay

## 2021-12-06 DIAGNOSIS — J069 Acute upper respiratory infection, unspecified: Secondary | ICD-10-CM

## 2021-12-06 DIAGNOSIS — R051 Acute cough: Secondary | ICD-10-CM

## 2021-12-06 DIAGNOSIS — R059 Cough, unspecified: Secondary | ICD-10-CM | POA: Diagnosis not present

## 2021-12-06 DIAGNOSIS — R0602 Shortness of breath: Secondary | ICD-10-CM | POA: Diagnosis not present

## 2021-12-06 DIAGNOSIS — J40 Bronchitis, not specified as acute or chronic: Secondary | ICD-10-CM

## 2021-12-06 MED ORDER — AEROCHAMBER PLUS FLO-VU SMALL MISC
1.0000 | Freq: Once | Status: AC
  Administered 2021-12-06: 1

## 2021-12-06 MED ORDER — ALBUTEROL SULFATE HFA 108 (90 BASE) MCG/ACT IN AERS
INHALATION_SPRAY | RESPIRATORY_TRACT | Status: AC
  Filled 2021-12-06: qty 6.7

## 2021-12-06 MED ORDER — PREDNISONE 10 MG PO TABS: 10.0000 mg | ORAL_TABLET | Freq: Three times a day (TID) | ORAL | 0 refills | Status: DC

## 2021-12-06 MED ORDER — ALBUTEROL SULFATE HFA 108 (90 BASE) MCG/ACT IN AERS
2.0000 | INHALATION_SPRAY | Freq: Once | RESPIRATORY_TRACT | Status: AC
  Administered 2021-12-06: 2 via RESPIRATORY_TRACT

## 2021-12-06 MED ORDER — HYDROCODONE BIT-HOMATROP MBR 5-1.5 MG/5ML PO SOLN: 5.0000 mL | Freq: Four times a day (QID) | ORAL | 0 refills | Status: DC | PRN

## 2021-12-06 NOTE — ED Triage Notes (Signed)
Cough and runny nose that started 4 weeks ago. Cough hasn't gone away and feels like it is getting worse. Patient states that she can not sleep at night due to the cough. Has been taking OTC medications, mucinex, dayquil cough syrup but not helping with cough.

## 2021-12-06 NOTE — ED Provider Notes (Signed)
[facility]    CSN: [id number] Arrival date & time: 12/06/21  0849      History   Chief Complaint Chief Complaint  Patient presents with   Cough   Nasal Congestion    HPI Shannon Khan is a 61 y.o. female.   61 year old female presents with 1 month duration of cough.  History is being taken with the husband as the interpreter, the patient does speak broken [place], the language is Twi.  Patient indicates for the past 4 weeks she has been having persistent cough and chest congestion.  Patient indicates production is usually yellow and thick.  Patient relates that she mainly only coughs at night when she lies down and tries to go to sleep, she tends to go into coughing fits and it tends to keep her up.  Indicates she has been using several different OTC cough preparations which does not really help control her cough.  Patient also indicates she is having some upper respiratory congestion with rhinitis which is mainly clear, postnasal drip, and sinus congestion.  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  Patient denies nausea or vomiting and she is tolerating fluids and eating foods normally.  Patient is a non-smoker.   Cough Associated symptoms: rhinorrhea, shortness of breath and wheezing     Past Medical History:  Diagnosis Date   Hypertension     Patient Active Problem List   Diagnosis Date Noted   Hyperlipidemia associated with type 2 diabetes mellitus ([place]) 08/24/2019   Type 2 diabetes mellitus with diabetic polyneuropathy, without long-term current use of insulin ([place]) 03/24/2019   Acquired thrombophilia ([place]) 03/24/2019   Primary osteoarthritis of both knees 03/24/2019   Class 3 severe obesity due to excess calories with serious comorbidity and body mass index (BMI) of 45.0 to 49.9 in adult ([place]) 11/19/2018   Numbness and  tingling of both feet 11/19/2018   PAF (paroxysmal atrial fibrillation) ([place]) 11/18/2018   DM (diabetes mellitus), type 2 ([place]) 10/12/2018   Imbalance 10/11/2018   On continuous oral anticoagulation 10/11/2018   Atrial fibrillation with RVR ([place]) 09/17/2018   Essential hypertension 06/14/2018   Obesity (BMI 30-39.9) 06/14/2018   History of thyroidectomy 06/14/2018   Chronic pain of both knees 06/14/2018    Past Surgical History:  Procedure Laterality Date   ORIF TIBIA & FIBULA FRACTURES Left 2012   THYROIDECTOMY  1990   for goiter    OB History   No obstetric history on file.      Home Medications    Prior to Admission medications   Medication Sig Start Date End Date Taking? Authorizing Provider  aspirin EC 81 MG tablet Take 1 tablet (81 mg total) by mouth daily. Swallow whole. 12/02/19  Yes Minus Breeding, MD  atorvastatin (LIPITOR) 10 MG tablet Take 1 tablet (10 mg total) by mouth daily. 10/23/21  Yes Ladell Pier, MD  Blood Glucose Monitoring Suppl (ONETOUCH VERIO REFLECT) w/Device KIT 1 kit by Does not apply route 2 (two) times daily. 02/02/19  Yes Ladell Pier, MD  carvedilol (COREG) 6.25 MG tablet Take 1 tablet (6.25 mg total) by mouth 2 (two) times daily with a meal. 10/23/21  Yes Ladell Pier, MD  diltiazem (CARDIZEM CD) 240 MG 24 hr capsule Take 1 capsule (240 mg total) by mouth daily. 11/04/21  Yes  Ladell Pier, MD  glucose blood [facility] VERIO) test strip Use as instructed 02/02/19  Yes Ladell Pier, MD  HYDROcodone bit-homatropine (HYCODAN) 5-1.5 MG/5ML syrup Take 5 mLs by mouth every 6 (six) hours as needed for cough. 12/06/21  Yes Nyoka Lint, PA-C  losartan (COZAAR) 100 MG tablet Take 1 tablet (100 mg total) by mouth daily. 10/23/21  Yes Ladell Pier, MD  metFORMIN (GLUCOPHAGE) 500 MG tablet Take 1 tablet (500 mg total) by mouth daily with breakfast. 10/23/21  Yes Ladell Pier, MD  OneTouch Delica Lancets 28B MISC Use to check  blood sugar twice daily. 02/02/19  Yes Ladell Pier, MD  predniSONE (DELTASONE) 10 MG tablet Take 1 tablet (10 mg total) by mouth 3 (three) times daily. 12/06/21  Yes Nyoka Lint, PA-C  spironolactone (ALDACTONE) 25 MG tablet Take 1 tablet (25 mg total) by mouth daily. 10/23/21  Yes Ladell Pier, MD    Family History Family History  Problem Relation Age of Onset   Hypertension Mother    Hypertension Father    Stroke Father    Diabetes Paternal Aunt     Social History Social History   Tobacco Use   Smoking status: Never   Smokeless tobacco: Never  Vaping Use   Vaping Use: Never used  Substance Use Topics   Alcohol use: Not Currently    Comment: occasional wine   Drug use: Never     Allergies   Patient has no known allergies.   Review of Systems Review of Systems  HENT:  Positive for postnasal drip, rhinorrhea and sinus pressure.   Respiratory:  Positive for cough, shortness of breath and wheezing.      Physical Exam Triage Vital Signs ED Triage Vitals  Enc Vitals Group     BP 12/06/21 0932 137/80     Pulse Rate 12/06/21 0932 84     Resp 12/06/21 0932 18     Temp 12/06/21 0932 98.9 F (37.2 C)     Temp Source 12/06/21 0932 Oral     SpO2 12/06/21 0932 96 %     Weight --      Height --      Head Circumference --      Peak Flow --      Pain Score 12/06/21 0935 2     Pain Loc --      Pain Edu? --      Excl. in [place]? --    No data found.  Updated Vital Signs BP 137/80 (BP Location: Right Arm)   Pulse 84   Temp 98.9 F (37.2 C) (Oral)   Resp 18   SpO2 96%   Visual Acuity Right Eye Distance:   Left Eye Distance:   Bilateral Distance:    Right Eye Near:   Left Eye Near:    Bilateral Near:     Physical Exam Constitutional:      Appearance: Normal appearance.  HENT:     Right Ear: Ear canal normal. Tympanic membrane is injected.     Left Ear: Ear canal normal. Tympanic membrane is injected.     Mouth/Throat:     Mouth: Mucous membranes  are moist.     Pharynx: Oropharynx is clear. No posterior oropharyngeal erythema.  Cardiovascular:     Rate and Rhythm: Normal rate and regular rhythm.     Heart sounds: Normal heart sounds.  Pulmonary:     Effort: Pulmonary effort is normal.     Breath sounds:  Normal breath sounds and air entry. No wheezing, rhonchi or rales.  Lymphadenopathy:     Cervical: No cervical adenopathy.  Neurological:     Mental Status: She is alert.      UC Treatments / Results  Labs (all labs ordered are listed, but only abnormal results are displayed) Labs Reviewed - No data to display  EKG   Radiology DG Chest 2 View  Result Date: 12/06/2021 CLINICAL DATA:  Cough, shortness of breath. EXAM: CHEST - 2 VIEW COMPARISON:  October 09, 2021.  January 27, 2018.  September 16, 2018. FINDINGS: The heart size and mediastinal contours are within normal limits. Left lung is clear. Grossly stable appearance of irregular density seen in the right midlung, although it is somewhat obscured by overlying rib on current radiograph. This may simply represent scarring, but some degree of nodular component cannot be excluded. The visualized skeletal structures are unremarkable. IMPRESSION: Grossly stable irregular density seen in right midlung, although it is not as well seen on current radiograph due to overlying rib. This may simply represent scarring, but some degree of nodular component cannot be excluded. CT scan or short-term radiographic follow-up is recommended to ensure stability. Electronically Signed   By: Marijo Conception M.D.   On: 12/06/2021 10:33    Procedures Procedures (including critical care time)  Medications Ordered in UC Medications  albuterol (VENTOLIN HFA) 108 (90 Base) MCG/ACT inhaler 2 puff (2 puffs Inhalation Given 12/06/21 1008)  AeroChamber Plus Flo-Vu Small device MISC 1 each (1 each Other Given 12/06/21 1008)    Initial Impression / Assessment and Plan / UC Course  I have reviewed the  triage vital signs and the nursing notes.  Pertinent labs & imaging results that were available during my care of the patient were reviewed by me and considered in my medical decision making (see chart for details).    Plan: 1.  The viral upper respiratory infection will be treated with the following: A.  Hycodan cough syrup, 1 to 2 teaspoons every 6-8 hours as needed for cough and congestion. 2.  Nilda Calamity will be treated with the following: A.  Albuterol inhaler with spacer, 2 puffs every 6 hours on a regular basis to control the cough, congestion, and wheezing. B.  Hycodan cough syrup, 1 to 2 teaspoons every 6-8 hours as needed for cough and chest congestion. C.  Prednisone 10 mg, 1 3 times a day for 5 days only to help reduce the respiratory inflammatory process. 3.  The acute cough be treated with the following: A.  Hycodan cough syrup, 1 to 2 teaspoons every 6-8 hours as needed for cough and chest congestion. 4.  Patient advised follow-up PCP or return to urgent care for repeat chest x-ray in 2 weeks after cough and congestion clears to make sure that the right mid lung density resolves.  (Consider chest CT scan if still present on repeat x-ray) Final Clinical Impressions(s) / UC Diagnoses   Final diagnoses:  Viral upper respiratory tract infection  Bronchitis  Acute cough     Discharge Instructions      Advised to use the albuterol inhaler with spacer, 2 puffs every 6 hours on a regular basis to help decrease cough and wheezing.  Make sure to use the inhaler about 45 minutes before bedtime as this will help cut down the cough. Advised to use the Hycodan cough syrup, 1 to 2 teaspoons every 6-8 hours as needed for cough.  (Be careful with this medicine because  it makes him individual sleepy, use mainly when you are at home and right before bedtime.  Use a cough formula such as Delsym or Mucinex DM during the daytime when you are at work if needed) Advised to take prednisone 10 mg 3  times a day for 5 days only as this will help reduce the respiratory inflammatory process. Advised to follow-up PCP or return to urgent care if symptoms fail to improve.  Advise repeat chest x-ray in 2 weeks after the cough and congestion is clear to make sure that lung marking density clears after the congestion resolves.    ED Prescriptions     Medication Sig Dispense Auth. Provider   HYDROcodone bit-homatropine (HYCODAN) 5-1.5 MG/5ML syrup Take 5 mLs by mouth every 6 (six) hours as needed for cough. 120 mL Nyoka Lint, PA-C   predniSONE (DELTASONE) 10 MG tablet Take 1 tablet (10 mg total) by mouth 3 (three) times daily. 15 tablet Nyoka Lint, PA-C      I have reviewed the PDMP during this encounter.   Nyoka Lint, PA-C 12/06/21 1046

## 2021-12-06 NOTE — Discharge Instructions (Addendum)
Advised to use the albuterol inhaler with spacer, 2 puffs every 6 hours on a regular basis to help decrease cough and wheezing.  Make sure to use the inhaler about 45 minutes before bedtime as this will help cut down the cough. Advised to use the Hycodan cough syrup, 1 to 2 teaspoons every 6-8 hours as needed for cough.  (Be careful with this medicine because it makes him individual sleepy, use mainly when you are at home and right before bedtime.  Use a cough formula such as Delsym or Mucinex DM during the daytime when you are at work if needed) Advised to take prednisone 10 mg 3 times a day for 5 days only as this will help reduce the respiratory inflammatory process. Advised to follow-up PCP or return to urgent care if symptoms fail to improve.  Advise repeat chest x-ray in 2 weeks after the cough and congestion is clear to make sure that lung marking density clears after the congestion resolves.

## 2021-12-22 ENCOUNTER — Other Ambulatory Visit (HOSPITAL_COMMUNITY): Payer: Self-pay

## 2021-12-23 ENCOUNTER — Other Ambulatory Visit (HOSPITAL_COMMUNITY): Payer: Self-pay

## 2021-12-26 ENCOUNTER — Other Ambulatory Visit: Payer: Self-pay

## 2021-12-26 ENCOUNTER — Other Ambulatory Visit (HOSPITAL_COMMUNITY): Payer: Self-pay

## 2021-12-27 ENCOUNTER — Ambulatory Visit
Admission: RE | Admit: 2021-12-27 | Discharge: 2021-12-27 | Disposition: A | Discharge status: Home / Self Care | Payer: BC Managed Care – PPO | Source: Ambulatory Visit | Attending: Internal Medicine | Admitting: Internal Medicine

## 2021-12-27 ENCOUNTER — Ambulatory Visit: Payer: BC Managed Care – PPO | Attending: Internal Medicine

## 2021-12-27 ENCOUNTER — Other Ambulatory Visit (HOSPITAL_COMMUNITY): Payer: Self-pay

## 2021-12-27 DIAGNOSIS — R918 Other nonspecific abnormal finding of lung field: Secondary | ICD-10-CM

## 2021-12-27 DIAGNOSIS — Z1231 Encounter for screening mammogram for malignant neoplasm of breast: Secondary | ICD-10-CM | POA: Diagnosis not present

## 2021-12-27 DIAGNOSIS — I700 Atherosclerosis of aorta: Secondary | ICD-10-CM | POA: Diagnosis not present

## 2021-12-27 DIAGNOSIS — J984 Other disorders of lung: Secondary | ICD-10-CM | POA: Diagnosis not present

## 2021-12-27 DIAGNOSIS — K449 Diaphragmatic hernia without obstruction or gangrene: Secondary | ICD-10-CM | POA: Diagnosis not present

## 2021-12-27 MED ORDER — IOPAMIDOL (ISOVUE-300) INJECTION 61%
75.0000 mL | Freq: Once | INTRAVENOUS | Status: AC | PRN
  Administered 2021-12-27: 75 mL via INTRAVENOUS

## 2021-12-29 ENCOUNTER — Encounter: Payer: Self-pay | Admitting: Internal Medicine

## 2021-12-30 ENCOUNTER — Telehealth: Payer: Self-pay | Admitting: Internal Medicine

## 2021-12-30 ENCOUNTER — Other Ambulatory Visit: Payer: Self-pay | Admitting: Internal Medicine

## 2021-12-30 DIAGNOSIS — E785 Hyperlipidemia, unspecified: Secondary | ICD-10-CM

## 2021-12-30 DIAGNOSIS — E1169 Type 2 diabetes mellitus with other specified complication: Secondary | ICD-10-CM

## 2021-12-30 DIAGNOSIS — E041 Nontoxic single thyroid nodule: Secondary | ICD-10-CM

## 2021-12-30 DIAGNOSIS — I10 Essential (primary) hypertension: Secondary | ICD-10-CM

## 2021-12-30 MED ORDER — DILTIAZEM HCL ER COATED BEADS 240 MG PO CP24: 240.0000 mg | ORAL_CAPSULE | Freq: Every day | ORAL | 1 refills | Status: DC

## 2021-12-30 MED ORDER — CARVEDILOL 6.25 MG PO TABS: 6.2500 mg | ORAL_TABLET | Freq: Two times a day (BID) | ORAL | 1 refills | Status: DC

## 2021-12-30 MED ORDER — SPIRONOLACTONE 25 MG PO TABS: 25.0000 mg | ORAL_TABLET | Freq: Every day | ORAL | 1 refills | Status: DC

## 2021-12-30 MED ORDER — ATORVASTATIN CALCIUM 10 MG PO TABS: 10.0000 mg | ORAL_TABLET | Freq: Every day | ORAL | 1 refills | Status: DC

## 2021-12-30 MED ORDER — METFORMIN HCL 500 MG PO TABS: 500.0000 mg | ORAL_TABLET | Freq: Every day | ORAL | 1 refills | Status: DC

## 2021-12-30 MED ORDER — LOSARTAN POTASSIUM 100 MG PO TABS: 100.0000 mg | ORAL_TABLET | Freq: Every day | ORAL | 1 refills | Status: DC

## 2021-12-30 NOTE — Telephone Encounter (Signed)
Phone call placed to patient's daughter today Hughie Closs to go over the results of the CAT scan of the chest. Informed her that this showed the area of concern in the right lung to be an area of scarring or volume loss known as atelectasis.  The radiologist recommended repeat scan without contrast in 3 to 6 months to make sure that this stays stable.  Patient's daughter tells me that she is not having any symptoms.  We will plan to order the repeat scan on her follow-up visit with me in March. Incidental finding on the x-ray was nonobstructive kidney stone in the left kidney.  Advised decrease salt intake and drinking several glasses of water a day to help decrease formation of more stones. -Incidental finding of aortic atherosclerosis.  I explained to her what that is.  Patient is on atorvastatin. -Incidental finding of 2.2 cm left thyroid nodule.  Radiologist recommend thyroid ultrasound.  I tried to clarify whether patient had thyroid surgery in the past for goiter and whether the thyroid gland was removed.  Patient's daughter states that her mother had told her she had thyroid surgery in the past but she does not know type of surgery and whether the thyroid gland was completely removed.  She is agreeable for me to schedule the thyroid ultrasound.

## 2022-01-03 LAB — PTH, INTACT AND CALCIUM
Calcium: 10.5 mg/dL — ABNORMAL HIGH (ref 8.7–10.3)
PTH: 45 pg/mL (ref 15–65)

## 2022-01-03 LAB — TSH: TSH: 1.57 u[IU]/mL (ref 0.450–4.500)

## 2022-01-03 LAB — VITAMIN D 25 HYDROXY (VIT D DEFICIENCY, FRACTURES): Vit D, 25-Hydroxy: 26.9 ng/mL — ABNORMAL LOW (ref 30.0–100.0)

## 2022-01-03 LAB — PTH-RELATED PEPTIDE: PTH-related peptide: <2 pmol/L

## 2022-01-10 ENCOUNTER — Ambulatory Visit
Admission: RE | Admit: 2022-01-10 | Discharge: 2022-01-10 | Disposition: A | Discharge status: Home / Self Care | Payer: BC Managed Care – PPO | Source: Ambulatory Visit | Attending: Internal Medicine | Admitting: Internal Medicine

## 2022-01-10 DIAGNOSIS — E041 Nontoxic single thyroid nodule: Secondary | ICD-10-CM | POA: Diagnosis not present

## 2022-01-19 ENCOUNTER — Encounter: Payer: Self-pay | Admitting: Internal Medicine

## 2022-03-28 ENCOUNTER — Encounter: Payer: Self-pay | Admitting: Internal Medicine

## 2022-03-28 ENCOUNTER — Ambulatory Visit: Payer: BC Managed Care – PPO | Attending: Internal Medicine | Admitting: Internal Medicine

## 2022-03-28 VITALS — BP 117/74 | HR 65 | Temp 98.3°F | Ht 62.0 in | Wt 202.0 lb

## 2022-03-28 DIAGNOSIS — Z23 Encounter for immunization: Secondary | ICD-10-CM | POA: Diagnosis not present

## 2022-03-28 DIAGNOSIS — E559 Vitamin D deficiency, unspecified: Secondary | ICD-10-CM | POA: Diagnosis not present

## 2022-03-28 DIAGNOSIS — E669 Obesity, unspecified: Secondary | ICD-10-CM

## 2022-03-28 DIAGNOSIS — M170 Bilateral primary osteoarthritis of knee: Secondary | ICD-10-CM

## 2022-03-28 DIAGNOSIS — Z7984 Long term (current) use of oral hypoglycemic drugs: Secondary | ICD-10-CM | POA: Diagnosis not present

## 2022-03-28 DIAGNOSIS — Z6836 Body mass index (BMI) 36.0-36.9, adult: Secondary | ICD-10-CM

## 2022-03-28 DIAGNOSIS — E785 Hyperlipidemia, unspecified: Secondary | ICD-10-CM

## 2022-03-28 DIAGNOSIS — E1159 Type 2 diabetes mellitus with other circulatory complications: Secondary | ICD-10-CM | POA: Diagnosis not present

## 2022-03-28 DIAGNOSIS — I480 Paroxysmal atrial fibrillation: Secondary | ICD-10-CM

## 2022-03-28 DIAGNOSIS — E042 Nontoxic multinodular goiter: Secondary | ICD-10-CM

## 2022-03-28 DIAGNOSIS — E1169 Type 2 diabetes mellitus with other specified complication: Secondary | ICD-10-CM | POA: Diagnosis not present

## 2022-03-28 DIAGNOSIS — R918 Other nonspecific abnormal finding of lung field: Secondary | ICD-10-CM

## 2022-03-28 DIAGNOSIS — I152 Hypertension secondary to endocrine disorders: Secondary | ICD-10-CM

## 2022-03-28 LAB — POCT GLYCOSYLATED HEMOGLOBIN (HGB A1C): HbA1c, POC (controlled diabetic range): 6.1 % (ref 0.0–7.0)

## 2022-03-28 LAB — GLUCOSE, POCT (MANUAL RESULT ENTRY): POC Glucose: 93 mg/dl (ref 70–99)

## 2022-03-28 MED ORDER — MELOXICAM 7.5 MG PO TABS: 7.5000 mg | ORAL_TABLET | Freq: Every day | ORAL | 1 refills | Status: DC

## 2022-03-28 NOTE — Progress Notes (Signed)
Patient ID: Shannon Khan, female    DOB: [birth year]  MRN: [id number]  CC: Hypertension (HTN assoc with DM f/u. Kinnie Feil completion of disability parking form /Intermittent pain of L knee X10 yrs due to accident/Already received flu vax)   Subjective: Shannon Khan is a 62 y.o. female who presents for chronic ds management Her concerns today include:  patient with history of HTN, obesity, excision of goiter, OA knees, A.fib ( Eliquis d/c by cardiology), new DM with  and neuropathy, latent TB treated through HD 2023, thyroid nodules (neds repeat US 12/2023).    Pt requesting form be completed for handicap sticker Has OA knees; LT more painful than RT. Also has a metal rod in LT lower leg from [place].  Takes Tylenol which does not help.    HL/aortic atherosclerosis: Reports compliance with taking atorvastatin 10 mg daily.  HTN/A.fib: Taking and tolerating diltiazem 240 mg daily, Cozaar 100 mg daily and spironolactone 25 mg daily and carvedilol 6.25 mg twice a day.Evelina Bucy already today She limits salt in the foods. Checks BP regularly.  Reports readings have been good No chest pains or shortness of breath.  No lower extremity edema.  No palpitations.   DM: Results for orders placed or performed in visit on 03/28/22  POCT glucose (manual entry)  Result Value Ref Range   POC Glucose 93 70 - 99 mg/dl  POCT glycosylated hemoglobin (Hb A1C)  Result Value Ref Range   Hemoglobin A1C     HbA1c POC (<> result, manual entry)     HbA1c, POC (prediabetic range)     HbA1c, POC (controlled diabetic range) 6.1 0.0 - 7.0 %  Reports compliance with taking metformin 500 mg daily. Checks BS once a day.  86-99 Does well with eating habits. Wgh down 18 lbs since last in-person visit in 04/2020.   Patient with history of latent TB.  Chest x-ray had showed some changes in the right upper lobe.  CT scan reveals scarring or volume loss due to atelectasis.   Radiologist recommended repeat CAT scan in about 3-6 mths.  Due for repeat scan b/w now and June.  Pt wants to extend it out as much as possible due to too much bills at this time.   Incidental finding on CT of the chest was 2.2 left thyroid nodule.  Thyroid ultrasound showed bilateral thyroid nodules largest of which was in the left upper and left lower lobes.  Radiologist recommended repeat thyroid ultrasound in 1 year.  No compressive symptoms. Had [place] on her thyroid gland over [place] yrs [place]  Hypercalcemia:  repeat Ca level had decreased to 10.5.  Vit D level was mildly low at 26.9.  Was told to purchase and take Vit D 400 IU OTC.  She has purchase and takes daily.    Patient Active Problem List   Diagnosis Date Noted   Multiple thyroid nodules 03/28/2022   Hyperlipidemia associated with type 2 diabetes mellitus ([place]) 08/24/2019   Type 2 diabetes mellitus with diabetic polyneuropathy, without long-term current use of insulin ([place]) 03/24/2019   Acquired thrombophilia ([place]) 03/24/2019   Primary osteoarthritis of both knees 03/24/2019   Class 3 severe obesity due to excess calories with serious comorbidity and body mass index (BMI) of 45.0 to 49.9 in adult ([place]) 11/19/2018   Numbness and tingling of both feet 11/19/2018   PAF (paroxysmal atrial fibrillation) ([place]) 11/18/2018   DM (diabetes mellitus), type  2 ([place]) 10/12/2018   Imbalance 10/11/2018   On continuous oral anticoagulation 10/11/2018   Atrial fibrillation with RVR ([place]) 09/17/2018   Essential hypertension 06/14/2018   Obesity (BMI 30-39.9) 06/14/2018   History of thyroidectomy 06/14/2018   Chronic pain of both knees 06/14/2018     Current Outpatient Medications on File Prior to Visit  Medication Sig Dispense Refill   aspirin EC 81 MG tablet Take 1 tablet (81 mg total) by mouth daily. Swallow whole. 90 each 3   atorvastatin (LIPITOR) 10 MG tablet Take 1 tablet (10 mg total) by mouth daily. 90 tablet 1   Blood  Glucose Monitoring Suppl (ONETOUCH VERIO REFLECT) w/Device KIT 1 kit by Does not apply route 2 (two) times daily. 1 kit 0   carvedilol (COREG) 6.25 MG tablet Take 1 tablet (6.25 mg total) by mouth 2 (two) times daily with a meal. 180 tablet 1   diltiazem (CARDIZEM CD) 240 MG 24 hr capsule Take 1 capsule (240 mg total) by mouth daily. 90 capsule 1   glucose blood (ONETOUCH VERIO) test strip Use as instructed 100 each 12   losartan (COZAAR) 100 MG tablet Take 1 tablet (100 mg total) by mouth daily. 90 tablet 1   metFORMIN (GLUCOPHAGE) 500 MG tablet Take 1 tablet (500 mg total) by mouth daily with breakfast. 90 tablet 1   OneTouch Delica Lancets 99991111 MISC Use to check blood sugar twice daily. 100 each 11   spironolactone (ALDACTONE) 25 MG tablet Take 1 tablet (25 mg total) by mouth daily. 90 tablet 1   No current facility-administered medications on file prior to visit.    No Known Allergies  Social History   Socioeconomic History   Marital status: Single    Spouse name: Not on file   Number of children: 3   Years of education: completed high school   Highest education level: Not on file  Occupational History   Not on file  Tobacco Use   Smoking status: Never   Smokeless tobacco: Never  Vaping Use   Vaping Use: Never used  Substance and Sexual Activity   Alcohol use: Not Currently    Comment: occasional wine   Drug use: Never   Sexual activity: Yes  Other Topics Concern   Not on file  Social History Narrative   Not on file   Social Determinants of Health   Financial Resource Strain: Not on file  Food Insecurity: Not on file  Transportation Needs: Not on file  Physical Activity: Not on file  Stress: Not on file  Social Connections: Not on file  Intimate Partner Violence: Not on file    Family History  Problem Relation Age of Onset   Hypertension Mother    Hypertension Father    Stroke Father    Diabetes Paternal Aunt    Breast cancer Neg Hx     Past Surgical  History:  Procedure Laterality Date   ORIF TIBIA & FIBULA FRACTURES Left 2012   THYROIDECTOMY  1990   for goiter    ROS: Review of Systems Negative except as stated above  PHYSICAL EXAM: BP 117/74 (BP Location: Left Arm, Patient Position: Sitting, Cuff Size: Normal)   Pulse 65   Temp 98.3 F (36.8 C) (Oral)   Ht '5\' 2"'$  (1.575 m)   Wt 202 lb (91.6 kg)   SpO2 99%   BMI 36.95 kg/m   Wt Readings from Last 3 Encounters:  03/28/22 202 lb (91.6 kg)  01/31/21 217 lb 3.2  oz (98.5 kg)  05/01/20 220 lb 9.6 oz (100.1 kg)    Physical Exam  General appearance - alert, well appearing, and in no distress Mental status - normal mood, behavior, speech, dress, motor activity, and thought processes Chest - clear to auscultation, no wheezes, rales or rhonchi, symmetric air entry Heart - normal rate, regular rhythm, normal S1, S2, no murmurs, rubs, clicks or gallops Neck: Healed scar at the base of the neck anteriorly.  Mild diffuse enlargement of the thyroid gland. Musculoskeletal -pt has valgum deformity of knees.  knees: No point tenderness.  Mild discomfort with passive range of motion left greater than right. Extremities -no lower extremity edema. Diabetic Foot Exam - Simple   Simple Foot Form Diabetic Foot exam was performed with the following findings: Yes 03/28/2022 12:42 PM  Visual Inspection No deformities, no ulcerations, no other skin breakdown bilaterally: Yes Sensation Testing Intact to touch and monofilament testing bilaterally: Yes Pulse Check Posterior Tibialis and Dorsalis pulse intact bilaterally: Yes Comments          Latest Ref Rng & Units 12/27/2021   10:21 AM 10/23/2021    8:38 AM 02/06/2021    8:49 AM  CMP  Glucose 70 - 99 mg/dL  110  84   BUN 8 - 27 mg/dL  15  14   Creatinine 0.57 - 1.00 mg/dL  0.84  0.87   Sodium 134 - 144 mmol/L  140  142   Potassium 3.5 - 5.2 mmol/L  4.7  5.0   Chloride 96 - 106 mmol/L  101  105   CO2 20 - 29 mmol/L  24  25   Calcium  8.7 - 10.3 mg/dL 10.5  10.8  10.5   Total Protein 6.0 - 8.5 g/dL  7.9    Total Bilirubin 0.0 - 1.2 mg/dL  0.3    Alkaline Phos 44 - 121 IU/L  114    AST 0 - 40 IU/L  28    ALT 0 - 32 IU/L  31     Lipid Panel     Component Value Date/Time   CHOL 154 10/23/2021 0838   TRIG 45 10/23/2021 0838   HDL 63 10/23/2021 0838   CHOLHDL 2.4 10/23/2021 0838   LDLCALC 81 10/23/2021 0838    CBC    Component Value Date/Time   WBC 6.8 10/23/2021 0838   WBC 9.8 09/17/2018 0337   RBC 5.25 10/23/2021 0838   RBC 4.74 09/17/2018 0337   HGB 15.3 10/23/2021 0838   HCT 46.1 10/23/2021 0838   PLT 193 10/23/2021 0838   MCV 88 10/23/2021 0838   MCH 29.1 10/23/2021 0838   MCH 28.9 09/17/2018 0337   MCHC 33.2 10/23/2021 0838   MCHC 33.8 09/17/2018 0337   RDW 12.3 10/23/2021 0838   LYMPHSABS 3.5 (H) 10/11/2018 1127   MONOABS 0.5 09/17/2018 0337   EOSABS 0.1 10/11/2018 1127   BASOSABS 0.1 10/11/2018 1127    ASSESSMENT AND PLAN: 1. Type 2 diabetes mellitus with obesity (HCC) At goal.  Continue metformin 500 mg daily and healthy eating habits. - POCT glucose (manual entry) - POCT glycosylated hemoglobin (Hb A1C)  2. Hyperlipidemia associated with type 2 diabetes mellitus (HCC) Continue atorvastatin 10 mg daily.  3. Hypertension associated with diabetes ([place]) Continue Coreg 6.25 mg twice a day, Cardizem 240 mg daily, Cozaar 100 mg daily and spironolactone 25 mg daily.  Blood pressure is at goal.  4. PAF (paroxysmal atrial fibrillation) ([place]) On exam she sounds to be  in sinus rhythm.  She was taken off Eliquis a while back by cardiology.  Continue diltiazem and carvedilol.  5. Vitamin D deficiency Continue over-the-counter vitamin D supplement.  6. Multiple thyroid nodules Plan for repeat thyroid ultrasound in 1 year.  Nodule seen on ultrasound in the left lobe did not meet requirement for biopsy.  7. Need for shingles vaccine Patient agreeable to receiving first Shingrix vaccine today.  8.  Abnormal CT scan of lung Changes seen likely scarring or atelectasis.  Patient agreeable for [place] to push out repeat CAT scan until her next visit in 4 months which will be around July.  9. Primary osteoarthritis of both knees Commended her on weight loss. Form completed for permanent handicap sticker. - meloxicam (MOBIC) 7.5 MG tablet; Take 1 tablet (7.5 mg total) by mouth daily. For arthritis pain  Dispense: 90 tablet; Refill: [employer] phone interpreter used during this encounter. [id number]  Patient was given the opportunity to ask questions.  Patient verbalized understanding of the plan and was able to repeat key elements of the plan.   This documentation was completed using [employer].  Any transcriptional errors are unintentional.  Orders Placed This Encounter  Procedures   Varicella-zoster vaccine IM   POCT glucose (manual entry)   POCT glycosylated hemoglobin (Hb A1C)     Requested Prescriptions   Signed Prescriptions Disp Refills   meloxicam (MOBIC) 7.5 MG tablet 90 tablet 1    Sig: Take 1 tablet (7.5 mg total) by mouth daily. For arthritis pain    Return in about 4 months (around 07/28/2022).  Karle Plumber, MD, FACP

## 2022-04-14 ENCOUNTER — Other Ambulatory Visit: Payer: Self-pay

## 2022-04-14 ENCOUNTER — Other Ambulatory Visit: Payer: Self-pay | Admitting: Internal Medicine

## 2022-04-14 DIAGNOSIS — I10 Essential (primary) hypertension: Secondary | ICD-10-CM

## 2022-04-14 MED ORDER — DILTIAZEM HCL ER COATED BEADS 240 MG PO CP24
240.0000 mg | ORAL_CAPSULE | Freq: Every day | ORAL | 1 refills | Status: DC
  Filled 2022-04-14 – 2022-04-17 (×3): qty 30, 30d supply, fill #0
  Filled 2022-04-17: qty 90, 90d supply, fill #0
  Filled 2022-04-18: qty 30, 30d supply, fill #0
  Filled 2022-05-13: qty 30, 30d supply, fill #1
  Filled 2022-06-11: qty 30, 30d supply, fill #2
  Filled 2022-07-14: qty 30, 30d supply, fill #3

## 2022-04-14 NOTE — Telephone Encounter (Signed)
Medication Refill - Medication: diltiazem (CARDIZEM CD) 240 MG 24 hr capsule [id number]    Pt has been out of her medication for one week.  Has the patient contacted their pharmacy? Yes.   (Agent: If no, request that the patient contact the pharmacy for the refill. If patient does not wish to contact the pharmacy document the reason why and proceed with request.) (Agent: If yes, when and what did the pharmacy advise?)  Preferred Pharmacy (with phone number or street name):  [place] Phone: [phone]  Fax: [phone]     Has the patient been seen for an appointment in the last year OR does the patient have an upcoming appointment? Yes.    Agent: Please be advised that RX refills may take up to 3 business days. We ask that you follow-up with your pharmacy.

## 2022-04-14 NOTE — Telephone Encounter (Signed)
Requested Prescriptions  Pending Prescriptions Disp Refills   diltiazem (CARDIZEM CD) 240 MG 24 hr capsule 90 capsule 1    Sig: Take 1 capsule (240 mg total) by mouth daily.     Cardiovascular: Calcium Channel Blockers 3 Passed - 04/14/2022  1:48 PM      Passed - ALT in normal range and within 360 days    ALT  Date Value Ref Range Status  10/23/2021 31 0 - 32 IU/L Final         Passed - AST in normal range and within 360 days    AST  Date Value Ref Range Status  10/23/2021 28 0 - 40 IU/L Final         Passed - Cr in normal range and within 360 days    Creatinine, Ser  Date Value Ref Range Status  10/23/2021 0.84 0.57 - 1.00 mg/dL Final         Passed - Last BP in normal range    BP Readings from Last 1 Encounters:  03/28/22 117/74         Passed - Last Heart Rate in normal range    Pulse Readings from Last 1 Encounters:  03/28/22 65         Passed - Valid encounter within last 6 months    Recent Outpatient Visits           2 weeks ago Type 2 diabetes mellitus with obesity ([place])   [place] Karle Plumber B, MD   5 months ago Type 2 diabetes mellitus with morbid obesity [facility])   [place] Karle Plumber B, MD   1 year ago Type 2 diabetes mellitus with morbid obesity [facility])   [place] Karle Plumber B, MD   1 year ago Type 2 diabetes mellitus with morbid obesity [facility])   [place] Karle Plumber B, MD   2 years ago Type 2 diabetes mellitus with diabetic polyneuropathy, without long-term current use of insulin [facility])   [place], MD       Future Appointments             In 3 months Wynetta Emery, Dalbert Batman, MD [place]

## 2022-04-17 ENCOUNTER — Other Ambulatory Visit: Payer: Self-pay

## 2022-04-17 ENCOUNTER — Other Ambulatory Visit (HOSPITAL_COMMUNITY): Payer: Self-pay

## 2022-04-18 ENCOUNTER — Other Ambulatory Visit: Payer: Self-pay

## 2022-04-18 ENCOUNTER — Other Ambulatory Visit (HOSPITAL_COMMUNITY): Payer: Self-pay

## 2022-05-14 ENCOUNTER — Encounter (HOSPITAL_COMMUNITY): Payer: Self-pay

## 2022-05-14 ENCOUNTER — Other Ambulatory Visit (HOSPITAL_COMMUNITY): Payer: Self-pay

## 2022-05-14 ENCOUNTER — Other Ambulatory Visit: Payer: Self-pay

## 2022-06-12 ENCOUNTER — Other Ambulatory Visit (HOSPITAL_COMMUNITY): Payer: Self-pay

## 2022-06-12 ENCOUNTER — Other Ambulatory Visit: Payer: Self-pay

## 2022-06-20 ENCOUNTER — Other Ambulatory Visit: Payer: Self-pay | Admitting: Internal Medicine

## 2022-06-20 DIAGNOSIS — E1169 Type 2 diabetes mellitus with other specified complication: Secondary | ICD-10-CM

## 2022-06-20 DIAGNOSIS — I10 Essential (primary) hypertension: Secondary | ICD-10-CM

## 2022-06-20 NOTE — Telephone Encounter (Signed)
Requested medication (s) are due for refill today: yes to both  Requested medication (s) are on the active medication list: yes to both  Last refill:  both last refilled 12/30/21 #90 with 1 RF  Future visit scheduled: yes  Notes to clinic:  overdue lab work unable to RF per protocol   Requested Prescriptions  Pending Prescriptions Disp Refills   losartan (COZAAR) 100 MG tablet [Pharmacy Med Name: Losartan Potassium 100 MG Oral Tablet] 90 tablet     Sig: TAKE 1 TABLET BY MOUTH DAILY     Cardiovascular:  Angiotensin Receptor Blockers Failed - 06/20/2022  7:32 AM      Failed - Cr in normal range and within 180 days    Creatinine, Ser  Date Value Ref Range Status  10/23/2021 0.84 0.57 - 1.00 mg/dL Final         Failed - K in normal range and within 180 days    Potassium  Date Value Ref Range Status  10/23/2021 4.7 3.5 - 5.2 mmol/L Final         Passed - Patient is not pregnant      Passed - Last BP in normal range    BP Readings from Last 1 Encounters:  03/28/22 117/74         Passed - Valid encounter within last 6 months    Recent Outpatient Visits           2 months ago Type 2 diabetes mellitus with obesity (HCC)   [place] [facility], [facility]   8 months ago Type 2 diabetes mellitus with morbid obesity (HCC)   [place] [facility] Jonah Blue B, MD   1 year ago Type 2 diabetes mellitus with morbid obesity (HCC)   [place] [facility] Jonah Blue B, MD   2 years ago Type 2 diabetes mellitus with morbid obesity (HCC)   [place] [facility] Jon[facility] Blue B, MD   2 years ago Type 2 diabetes mellitus with diabetic polyneuropathy, without long-term current use of insulin (HCC)   [place] [facility] Marcine Matar, MD       Future Appointments             In 1 month Marcine Matar, MD [facility]             spironolactone (ALDACTONE) 25 MG tablet [Pharmacy Med Name: Spironolactone 25 MG Oral Tablet] 90 tablet     Sig: TAKE 1 TABLET BY MOUTH DAILY     Cardiovascular: Diuretics - Aldosterone Antagonist Failed - 06/20/2022  7:32 AM      Failed - Cr in normal range and within 180 days    Creatinine, Ser  Date Value Ref Range Status  10/23/2021 0.84 0.57 - 1.00 mg/dL Final         Failed - K in normal range and within 180 days    Potassium  Date Value Ref Range Status  10/23/2021 4.7 3.5 - 5.2 mmol/L Final         Failed - Na in normal range and within 180 days    Sodium  Date Value Ref Range Status  10/23/2021 140 134 - 144 mmol/L Final         Failed - eGFR is 30 or above and within 180 days    GFR calc Af Denyse Dago  Date Value Ref Range Status  01/14/2019 91 >59 mL/min/1.73 Final   GFR calc non Af Amer  Date Value Ref Range Status  01/14/2019 79 >59 mL/min/1.73 Final   eGFR  Date Value Ref Range Status  10/23/2021 78 >59 mL/min/1.73 Final         Passed - Last BP in normal range    BP Readings from Last 1 Encounters:  03/28/22 117/74         Passed - Valid encounter within last 6 months    Recent Outpatient Visits           2 months ago Type 2 diabetes mellitus with obesity (HCC)   [place] [facility] Jonah Blue B, MD   8 months ago Type 2 diabetes mellitus with morbid obesity (HCC)   [place] [facility] Jonah Blue B, MD   1 year ago Type 2 diabetes mellitus with morbid obesity (HCC)   [place] [facility] Jonah Blue B, MD   2 years ago Type 2 diabetes mellitus with morbid obesity (HCC)   [place] [facility] Jonah Blue B, MD   2 years ago Type 2 diabetes mellitus with diabetic polyneuropathy, without long-term current use of insulin (HCC)   [place] [facility] Marcine Matar, MD       Future Appointments             In 1 month Marcine Matar, MD [facility]            Signed Prescriptions Disp Refills   metFORMIN (GLUCOPHAGE) 500 MG tablet 90 tablet 0    Sig: TAKE 1 TABLET BY MOUTH DAILY  WITH BREAKFAST     Endocrinology:  Diabetes - Biguanides Failed - 06/20/2022  7:32 AM      Failed - B12 Level in normal range and within 720 days    Vitamin B-12  Date Value Ref Range Status  11/19/2018 913 232 - 1,245 pg/mL Final         Failed - CBC within normal limits and completed in the last 12 months    WBC  Date Value Ref Range Status  10/23/2021 6.8 3.4 - 10.8 x10E3/uL Final  09/17/2018 9.8 4.0 - 10.5 K/uL Final   RBC  Date Value Ref Range Status  10/23/2021 5.25 3.77 - 5.28 x10E6/uL Final  09/17/2018 4.74 3.87 - 5.11 MIL/uL Final   Hemoglobin  Date Value Ref Range Status  10/23/2021 15.3 11.1 - 15.9 g/dL Final   Hematocrit  Date Value Ref Range Status  10/23/2021 46.1 34.0 - 46.6 % Final   MCHC  Date Value Ref Range Status  10/23/2021 33.2 31.5 - 35.7 g/dL Final  16/10/9602 54.0 30.0 - 36.0 g/dL Final   [facility]  Date Value Ref Range Status  10/23/2021 29.1 26.6 - 33.0 pg Final  09/17/2018 28.9 26.0 - 34.0 pg Final   MCV  Date Value Ref Range Status  10/23/2021 88 79 - 97 fL Final   No results found for: "PLTCOUNTKUC", "LABPLAT", "POCPLA" RDW  Date Value Ref Range Status  10/23/2021 12.3 11.7 - 15.4 % Final         Passed - Cr in normal range and within 360 days    Creatinine, Ser  Date Value Ref Range Status  10/23/2021 0.84 0.57 - 1.00 mg/dL Final  Passed - HBA1C is between 0 and 7.9 and within 180 days    HbA1c, POC (controlled diabetic range)  Date Value Ref Range Status  03/28/2022 6.1 0.0 - 7.0 % Final         Passed - eGFR in normal range and within 360 days    GFR calc Af Amer  Date Value Ref Range Status  01/14/2019 91 >59 mL/min/1.73 Final   GFR calc  non Af Amer  Date Value Ref Range Status  01/14/2019 79 >59 mL/min/1.73 Final   eGFR  Date Value Ref Range Status  10/23/2021 78 >59 mL/min/1.73 Final         Passed - Valid encounter within last 6 months    Recent Outpatient Visits           2 months ago Type 2 diabetes mellitus with obesity (HCC)   [place] [facility] Jonah Blue B, MD   8 months ago Type 2 diabetes mellitus with morbid obesity [facility])   [place] [facility] Jonah Blue B, MD   1 year ago Type 2 diabetes mellitus with morbid obesity (HCC)   [place] [facility] Jonah Blue B, MD   2 years ago Type 2 diabetes mellitus with morbid obesity (HCC)   [place] [facility] Jonah Blue B, MD   2 years ago Type 2 diabetes mellitus with diabetic polyneuropathy, without long-term current use of insulin (HCC)   [place] [facility] Marcine Matar, MD       Future Appointments             In 1 month Marcine Matar, MD [place] [facility]             carvedilol (COREG) 6.25 MG tablet 180 tablet 0    Sig: TAKE 1 TABLET BY MOUTH TWICE  DAILY WITH A MEAL     Cardiovascular: Beta Blockers 3 Passed - 06/20/2022  7:32 AM      Passed - Cr in normal range and within 360 days    Creatinine, Ser  Date Value Ref Range Status  10/23/2021 0.84 0.57 - 1.00 mg/dL Final         Passed - AST in normal range and within 360 days    AST  Date Value Ref Range Status  10/23/2021 28 0 - 40 IU/L Final         Passed - ALT in normal range and within 360 days    ALT  Date Value Ref Range Status  10/23/2021 31 0 - 32 IU/L Final         Passed - Last BP in normal range    BP Readings from Last 1 Encounters:  03/28/22 117/74         Passed - Last Heart Rate in normal range    Pulse Readings from Last 1 Encounters:  03/28/22 65         Passed -  Valid encounter within last 6 months    Recent Outpatient Visits           2 months ago Type 2 diabetes mellitus with obesity (HCC)   [place] [facility] Jon[facility] Blue B, MD   8 months ago Type 2 diabetes mellitus with morbid obesity [facility])    [facility] Marcine Matar, MD   1  year ago Type 2 diabetes mellitus with morbid obesity (HCC)   [place] [facility] Jonah Blue B, MD   2 years ago Type 2 diabetes mellitus with morbid obesity [facility])   [place] [facility] Jonah Blue B, MD   2 years ago Type 2 diabetes mellitus with diabetic polyneuropathy, without long-term current use of insulin (HCC)   [place] [facility] Marcine Matar, MD       Future Appointments             In 1 month Marcine Matar, MD [place] [facility]             atorvastatin (LIPITOR) 10 MG tablet 90 tablet 0    Sig: TAKE 1 TABLET BY MOUTH DAILY     Cardiovascular:  Antilipid - Statins Failed - 06/20/2022  7:32 AM      Failed - Lipid Panel in normal range within the last 12 months    Cholesterol, Total  Date Value Ref Range Status  10/23/2021 154 100 - 199 mg/dL Final   LDL Chol Calc (NIH)  Date Value Ref Range Status  10/23/2021 81 0 - 99 mg/dL Final   HDL  Date Value Ref Range Status  10/23/2021 63 >39 mg/dL Final   Triglycerides  Date Value Ref Range Status  10/23/2021 45 0 - 149 mg/dL Final         Passed - Patient is not pregnant      Passed - Valid encounter within last 12 months    Recent Outpatient Visits           2 months ago Type 2 diabetes mellitus with obesity (HCC)   [place] [facility] Jon[facility] Blue B, MD   8 months ago Type 2 diabetes mellitus with morbid obesity [facility])   [place] [facility] Jonah Blue B, MD   1 year ago  Type 2 diabetes mellitus with morbid obesity [facility])   [place] [facility] Jonah Blue B, MD   2 years ago Type 2 diabetes mellitus with morbid obesity [facility])   [place] [facility] Jonah Blue B, MD   2 years ago Type 2 diabetes mellitus with diabetic polyneuropathy, without long-term current use of insulin [facility])   [place] [facility] Marcine Matar, MD       Future Appointments             In 1 month Laural Benes, Binnie Rail, MD [place] [facility]

## 2022-06-20 NOTE — Telephone Encounter (Signed)
Requested Prescriptions  Pending Prescriptions Disp Refills   metFORMIN (GLUCOPHAGE) 500 MG tablet [Pharmacy Med Name: metFORMIN HCl 500 MG Oral Tablet] 90 tablet 0    Sig: TAKE 1 TABLET BY MOUTH DAILY  WITH BREAKFAST     Endocrinology:  Diabetes - Biguanides Failed - 06/20/2022  7:32 AM      Failed - B12 Level in normal range and within 720 days    Vitamin B-12  Date Value Ref Range Status  11/19/2018 913 232 - 1,245 pg/mL Final         Failed - CBC within normal limits and completed in the last 12 months    WBC  Date Value Ref Range Status  10/23/2021 6.8 3.4 - 10.8 x10E3/uL Final  09/17/2018 9.8 4.0 - 10.5 K/uL Final   RBC  Date Value Ref Range Status  10/23/2021 5.25 3.77 - 5.28 x10E6/uL Final  09/17/2018 4.74 3.87 - 5.11 MIL/uL Final   Hemoglobin  Date Value Ref Range Status  10/23/2021 15.3 11.1 - 15.9 g/dL Final   Hematocrit  Date Value Ref Range Status  10/23/2021 46.1 34.0 - 46.6 % Final   MCHC  Date Value Ref Range Status  10/23/2021 33.2 31.5 - 35.7 g/dL Final  95/62/1308 65.7 30.0 - 36.0 g/dL Final   [facility]  Date Value Ref Range Status  10/23/2021 29.1 26.6 - 33.0 pg Final  09/17/2018 28.9 26.0 - 34.0 pg Final   MCV  Date Value Ref Range Status  10/23/2021 88 79 - 97 fL Final   No results found for: "PLTCOUNTKUC", "LABPLAT", "POCPLA" RDW  Date Value Ref Range Status  10/23/2021 12.3 11.7 - 15.4 % Final         Passed - Cr in normal range and within 360 days    Creatinine, Ser  Date Value Ref Range Status  10/23/2021 0.84 0.57 - 1.00 mg/dL Final         Passed - HBA1C is between 0 and 7.9 and within 180 days    HbA1c, POC (controlled diabetic range)  Date Value Ref Range Status  03/28/2022 6.1 0.0 - 7.0 % Final         Passed - eGFR in normal range and within 360 days    GFR calc Af Amer  Date Value Ref Range Status  01/14/2019 91 >59 mL/min/1.73 Final   GFR calc non Af Amer  Date Value Ref Range Status  01/14/2019 79 >59 mL/min/1.73  Final   eGFR  Date Value Ref Range Status  10/23/2021 78 >59 mL/min/1.73 Final         Passed - Valid encounter within last 6 months    Recent Outpatient Visits           2 months ago Type 2 diabetes mellitus with obesity (HCC)   [place] [facility] Jonah Blue B, MD   8 months ago Type 2 diabetes mellitus with morbid obesity [facility])   [place] [facility] Jonah Blue B, MD   1 year ago Type 2 diabetes mellitus with morbid obesity [facility])   [place] [facility] Jonah Blue B, MD   2 years ago Type 2 diabetes mellitus with morbid obesity [facility])   [place] [facility] Jonah Blue B, MD   2 years ago Type 2 diabetes mellitus with diabetic polyneuropathy, without long-term current use of insulin [facility])    [place] [facility] [place], Gavin Pound  B, MD       Future Appointments             In 1 month Laural Benes, Binnie Rail, MD [place] [facility]             losartan (COZAAR) 100 MG tablet [Pharmacy Med Name: Losartan Potassium 100 MG Oral Tablet] 90 tablet     Sig: TAKE 1 TABLET BY MOUTH DAILY     Cardiovascular:  Angiotensin Receptor Blockers Failed - 06/20/2022  7:32 AM      Failed - Cr in normal range and within 180 days    Creatinine, Ser  Date Value Ref Range Status  10/23/2021 0.84 0.57 - 1.00 mg/dL Final         Failed - K in normal range and within 180 days    Potassium  Date Value Ref Range Status  10/23/2021 4.7 3.5 - 5.2 mmol/L Final         Passed - Patient is not pregnant      Passed - Last BP in normal range    BP Readings from Last 1 Encounters:  03/28/22 117/74         Passed - Valid encounter within last 6 months    Recent Outpatient Visits           2 months ago Type 2 diabetes mellitus with obesity (HCC)   [place] [facility] Jon[facility] Blue B, MD   8  months ago Type 2 diabetes mellitus with morbid obesity (HCC)   [place] [facility] Jonah Blue B, MD   1 year ago Type 2 diabetes mellitus with morbid obesity (HCC)   [place] [facility] Jonah Blue B, MD   2 years ago Type 2 diabetes mellitus with morbid obesity (HCC)   [place] [facility] Jonah Blue B, MD   2 years ago Type 2 diabetes mellitus with diabetic polyneuropathy, without long-term current use of insulin (HCC)   [place] [facility] Marcine Matar, MD       Future Appointments             In 1 month Marcine Matar, MD [place] [facility]             spironolactone (ALDACTONE) 25 MG tablet [Pharmacy Med Name: Spironolactone 25 MG Oral Tablet] 90 tablet     Sig: TAKE 1 TABLET BY MOUTH DAILY     Cardiovascular: Diuretics - Aldosterone Antagonist Failed - 06/20/2022  7:32 AM      Failed - Cr in normal range and within 180 days    Creatinine, Ser  Date Value Ref Range Status  10/23/2021 0.84 0.57 - 1.00 mg/dL Final         Failed - K in normal range and within 180 days    Potassium  Date Value Ref Range Status  10/23/2021 4.7 3.5 - 5.2 mmol/L Final         Failed - Na in normal range and within 180 days    Sodium  Date Value Ref Range Status  10/23/2021 140 134 - 144 mmol/L Final         Failed - eGFR is 30 or above and within 180 days    GFR calc Af Amer  Date Value Ref Range Status  01/14/2019 91 >59 mL/min/1.73 Final   GFR calc non  Af Amer  Date Value Ref Range Status  01/14/2019 79 >59 mL/min/1.73 Final   eGFR  Date Value Ref Range Status  10/23/2021 78 >59 mL/min/1.73 Final         Passed - Last BP in normal range    BP Readings from Last 1 Encounters:  03/28/22 117/74         Passed - Valid encounter within last 6 months    Recent Outpatient Visits           2 months ago Type 2  diabetes mellitus with obesity (HCC)   [place] [facility] Jonah Blue B, MD   8 months ago Type 2 diabetes mellitus with morbid obesity [facility])   [place] [facility] Jonah Blue B, MD   1 year ago Type 2 diabetes mellitus with morbid obesity (HCC)   [place] [facility] Jon[facility] Blue B, MD   2 years ago Type 2 diabetes mellitus with morbid obesity (HCC)   [place] [facility] Jonah Blue B, MD   2 years ago Type 2 diabetes mellitus with diabetic polyneuropathy, without long-term current use of insulin (HCC)   [place] [facility] Marcine Matar, MD       Future Appointments             In 1 month Marcine Matar, MD [place] [facility]             carvedilol (COREG) 6.25 MG tablet [Pharmacy Med Name: Carvedilol 6.25 MG Oral Tablet] 180 tablet 0    Sig: TAKE 1 TABLET BY MOUTH TWICE  DAILY WITH A MEAL     Cardiovascular: Beta Blockers 3 Passed - 06/20/2022  7:32 AM      Passed - Cr in normal range and within 360 days    Creatinine, Ser  Date Value Ref Range Status  10/23/2021 0.84 0.57 - 1.00 mg/dL Final         Passed - AST in normal range and within 360 days    AST  Date Value Ref Range Status  10/23/2021 28 0 - 40 IU/L Final         Passed - ALT in normal range and within 360 days    ALT  Date Value Ref Range Status  10/23/2021 31 0 - 32 IU/L Final         Passed - Last BP in normal range    BP Readings from Last 1 Encounters:  03/28/22 117/74         Passed - Last Heart Rate in normal range    Pulse Readings from Last 1 Encounters:  03/28/22 65         Passed - Valid encounter within last 6 months    Recent Outpatient Visits           2 months ago Type 2 diabetes mellitus with obesity (HCC)   [place] [facility] Jon[facility] Blue B, MD   8 months  ago Type 2 diabetes mellitus with morbid obesity [facility])   [place] [facility] Jonah Blue B, MD   1 year ago Type 2 diabetes mellitus with morbid obesity [facility])    [place] [facility] Jonah Blue B, MD   2 years ago Type 2 diabetes mellitus with morbid obesity [facility])    [facility] Jonah Blue B, MD   2 years ago Type 2 diabetes mellitus with diabetic polyneuropathy, without long-term current use of insulin (HCC)   [place] [facility] Marcine Matar, MD       Future Appointments             In 1 month Marcine Matar, MD [place] [facility]             atorvastatin (LIPITOR) 10 MG tablet [Pharmacy Med Name: Atorvastatin Calcium 10 MG Oral Tablet] 90 tablet 0    Sig: TAKE 1 TABLET BY MOUTH DAILY     Cardiovascular:  Antilipid - Statins Failed - 06/20/2022  7:32 AM      Failed - Lipid Panel in normal range within the last 12 months    Cholesterol, Total  Date Value Ref Range Status  10/23/2021 154 100 - 199 mg/dL Final   LDL Chol Calc (NIH)  Date Value Ref Range Status  10/23/2021 81 0 - 99 mg/dL Final   HDL  Date Value Ref Range Status  10/23/2021 63 >39 mg/dL Final   Triglycerides  Date Value Ref Range Status  10/23/2021 45 0 - 149 mg/dL Final         Passed - Patient is not pregnant      Passed - Valid encounter within last 12 months    Recent Outpatient Visits           2 months ago Type 2 diabetes mellitus with obesity (HCC)   [place] [facility] Jon[facility] Blue B, MD   8 months ago Type 2 diabetes mellitus with morbid obesity [facility])   [place] [facility] Jonah Blue B, MD   1 year ago Type 2 diabetes mellitus with morbid obesity [facility])   [place] [facility] Jonah Blue B, MD   2 years ago Type 2 diabetes  mellitus with morbid obesity [facility])   [place] [facility] Jonah Blue B, MD   2 years ago Type 2 diabetes mellitus with diabetic polyneuropathy, without long-term current use of insulin [facility])   [place] [facility] Marcine Matar, MD       Future Appointments             In 1 month Laural Benes, Binnie Rail, MD [facility]

## 2022-07-14 ENCOUNTER — Other Ambulatory Visit (HOSPITAL_COMMUNITY): Payer: Self-pay

## 2022-07-21 ENCOUNTER — Telehealth: Payer: Self-pay | Admitting: Internal Medicine

## 2022-07-21 NOTE — Telephone Encounter (Signed)
Copied from CRM [id number]. Topic: Appointment Scheduling - Scheduling Inquiry for Clinic >> Jul 18, 2022  2:24 PM Epimenio Foot F wrote: Reason for CRM: Pt's daughter is calling in asking can pt's appointment on 08/01/22 can be changed to a virtual or phone appointment.

## 2022-07-22 NOTE — Telephone Encounter (Signed)
Called & LVM to call back. Please change appointment to a Mychart video visit only.

## 2022-07-23 NOTE — Telephone Encounter (Signed)
Called but no answer. LVM to inform that appointment has been changed to a video visit.

## 2022-08-01 ENCOUNTER — Telehealth (HOSPITAL_BASED_OUTPATIENT_CLINIC_OR_DEPARTMENT_OTHER): Payer: BC Managed Care – PPO | Admitting: Internal Medicine

## 2022-08-01 ENCOUNTER — Encounter: Payer: Self-pay | Admitting: Internal Medicine

## 2022-08-01 VITALS — BP 125/86 | Ht 62.0 in | Wt 193.0 lb

## 2022-08-01 DIAGNOSIS — Z7984 Long term (current) use of oral hypoglycemic drugs: Secondary | ICD-10-CM

## 2022-08-01 DIAGNOSIS — E1169 Type 2 diabetes mellitus with other specified complication: Secondary | ICD-10-CM | POA: Diagnosis not present

## 2022-08-01 DIAGNOSIS — I152 Hypertension secondary to endocrine disorders: Secondary | ICD-10-CM

## 2022-08-01 DIAGNOSIS — R918 Other nonspecific abnormal finding of lung field: Secondary | ICD-10-CM

## 2022-08-01 DIAGNOSIS — E785 Hyperlipidemia, unspecified: Secondary | ICD-10-CM

## 2022-08-01 DIAGNOSIS — E119 Type 2 diabetes mellitus without complications: Secondary | ICD-10-CM

## 2022-08-01 DIAGNOSIS — E1159 Type 2 diabetes mellitus with other circulatory complications: Secondary | ICD-10-CM

## 2022-08-01 DIAGNOSIS — E669 Obesity, unspecified: Secondary | ICD-10-CM

## 2022-08-01 MED ORDER — METFORMIN HCL 500 MG PO TABS: 500.0000 mg | ORAL_TABLET | Freq: Every day | ORAL | 2 refills | Status: DC

## 2022-08-01 MED ORDER — DILTIAZEM HCL ER COATED BEADS 240 MG PO CP24: 240.0000 mg | ORAL_CAPSULE | Freq: Every day | ORAL | 1 refills | Status: DC

## 2022-08-01 MED ORDER — LOSARTAN POTASSIUM 100 MG PO TABS: 100.0000 mg | ORAL_TABLET | Freq: Every day | ORAL | 2 refills | Status: DC

## 2022-08-01 MED ORDER — SPIRONOLACTONE 25 MG PO TABS: 25.0000 mg | ORAL_TABLET | Freq: Every day | ORAL | 2 refills | Status: DC

## 2022-08-01 MED ORDER — CARVEDILOL 6.25 MG PO TABS: 6.2500 mg | ORAL_TABLET | Freq: Two times a day (BID) | ORAL | 2 refills | Status: DC

## 2022-08-01 MED ORDER — ATORVASTATIN CALCIUM 10 MG PO TABS: 10.0000 mg | ORAL_TABLET | Freq: Every day | ORAL | 2 refills | Status: DC

## 2022-08-01 NOTE — Progress Notes (Signed)
Virtual Visit via Video Note  I connected with Meher Koon on 08/01/2022 at 1:11 PM by a video enabled telemedicine application and verified that I am speaking with the correct person using two identifiers.  Location: Patient: Shannon Khan at daughter's house Provider: Office   I discussed the limitations of evaluation and management by telemedicine and the availability of in person appointments. The patient expressed understanding and agreed to proceed.  History of Present Illness: patient with history of HTN, obesity, excision of goiter, OA knees, A.fib ( Eliquis d/c by cardiology), new DM with  and neuropathy, latent TB treated through HD 2023, thyroid nodules (neds repeat US 12/2022).    HTN/A.fib: Taking and tolerating diltiazem 240 mg daily, Cozaar 100 mg daily and spironolactone 25 mg daily and carvedilol 6.25 mg twice a day.Rochele Pages already today She limits salt in the foods. Checks BP daily.  Gives range 125-130/80-86.  This a.m was 125/86; had just taken her meds before checking  DM: Lab Results  Component Value Date   HGBA1C 6.1 03/28/2022  Reports compliance with taking metformin 500 mg daily. Checks BS once a day before BF.  Gives range 95-100.  This a.m was 95.  Doing well with eating habits.  Walks once a wk.  Wgh is 193 lbs today.  Down additional 9 lbs since last visit.  Patient with history of latent TB.  Chest x-ray had showed some changes in the right upper lobe.  CT scan revealed scarring or volume loss due to atelectasis.  Radiologist recommended repeat CAT scan in about 3-6 mths to ensure stability.  Due for repeat scan.  Denies any cough or other respiratory symptoms at this time.  Outpatient Encounter Medications as of 08/01/2022  Medication Sig   aspirin EC 81 MG tablet Take 1 tablet (81 mg total) by mouth daily. Swallow whole.   atorvastatin (LIPITOR) 10 MG tablet Take 1 tablet (10 mg total) by mouth daily.   Blood Glucose Monitoring Suppl (ONETOUCH VERIO  REFLECT) w/Device KIT 1 kit by Does not apply route 2 (two) times daily.   carvedilol (COREG) 6.25 MG tablet Take 1 tablet (6.25 mg total) by mouth 2 (two) times daily with a meal.   diltiazem (CARDIZEM CD) 240 MG 24 hr capsule Take 1 capsule (240 mg total) by mouth daily.   glucose blood (ONETOUCH VERIO) test strip Use as instructed   losartan (COZAAR) 100 MG tablet Take 1 tablet (100 mg total) by mouth daily.   meloxicam (MOBIC) 7.5 MG tablet Take 1 tablet (7.5 mg total) by mouth daily. For arthritis pain   metFORMIN (GLUCOPHAGE) 500 MG tablet Take 1 tablet (500 mg total) by mouth daily with breakfast.   OneTouch Delica Lancets 33G MISC Use to check blood sugar twice daily.   spironolactone (ALDACTONE) 25 MG tablet Take 1 tablet (25 mg total) by mouth daily.   [DISCONTINUED] atorvastatin (LIPITOR) 10 MG tablet TAKE 1 TABLET BY MOUTH DAILY   [DISCONTINUED] carvedilol (COREG) 6.25 MG tablet TAKE 1 TABLET BY MOUTH TWICE  DAILY WITH A MEAL   [DISCONTINUED] diltiazem (CARDIZEM CD) 240 MG 24 hr capsule Take 1 capsule (240 mg total) by mouth daily.   [DISCONTINUED] losartan (COZAAR) 100 MG tablet TAKE 1 TABLET BY MOUTH DAILY   [DISCONTINUED] metFORMIN (GLUCOPHAGE) 500 MG tablet TAKE 1 TABLET BY MOUTH DAILY  WITH BREAKFAST   [DISCONTINUED] spironolactone (ALDACTONE) 25 MG tablet TAKE 1 TABLET BY MOUTH DAILY   No facility-administered encounter medications on file as of 08/01/2022.  Observations/Objective: Blood pressure 125/86, weight 193 lb (87.5 kg).  Patient sitting in chair in NAD  Assessment and Plan: 1. Type 2 diabetes mellitus with obesity (HCC) Reported blood sugar readings are good.  Encouraged her to continue healthy eating habits and trying to move is much as she can.  Continue metformin. - Hemoglobin A1c; Future - CBC; Future - Comprehensive metabolic panel; Future - metFORMIN (GLUCOPHAGE) 500 MG tablet; Take 1 tablet (500 mg total) by mouth daily with breakfast.  Dispense: 90  tablet; Refill: 2  2. Diabetes mellitus treated with oral medication (HCC) See #1 above  3. Hyperlipidemia associated with type 2 diabetes mellitus (HCC) Continue atorvastatin. - Lipid panel; Future - atorvastatin (LIPITOR) 10 MG tablet; Take 1 tablet (10 mg total) by mouth daily.  Dispense: 90 tablet; Refill: 2  4. Hypertension associated with type 2 diabetes mellitus (HCC) Diastolic blood pressure running a little above goal.  However we will have her continue current medications and low-salt diet. - carvedilol (COREG) 6.25 MG tablet; Take 1 tablet (6.25 mg total) by mouth 2 (two) times daily with a meal.  Dispense: 180 tablet; Refill: 2 - diltiazem (CARDIZEM CD) 240 MG 24 hr capsule; Take 1 capsule (240 mg total) by mouth daily.  Dispense: 90 capsule; Refill: 1 - losartan (COZAAR) 100 MG tablet; Take 1 tablet (100 mg total) by mouth daily.  Dispense: 90 tablet; Refill: 2 - spironolactone (ALDACTONE) 25 MG tablet; Take 1 tablet (25 mg total) by mouth daily.  Dispense: 90 tablet; Refill: 2  5. Abnormal CT scan of lung Patient agreeable for repeat CAT scan at this time. - CT Chest W Contrast; Future   Follow Up Instructions: 4 months   I discussed the assessment and treatment plan with the patient. The patient was provided an opportunity to ask questions and all were answered. The patient agreed with the plan and demonstrated an understanding of the instructions.   The patient was advised to call back or seek an in-person evaluation if the symptoms worsen or if the condition fails to improve as anticipated.  I spent 16 minutes dedicated to the care of this patient on the date of this encounter to include previsit review of of her chart mainly my last 2 notes, face-to-face time with patient discussing diagnosis and management and postvisit entering of orders.  This note has been created with Education officer, environmental. Any transcriptional errors are  unintentional.  Jonah Blue, MD

## 2022-08-12 ENCOUNTER — Other Ambulatory Visit: Payer: Self-pay | Admitting: Internal Medicine

## 2022-08-12 ENCOUNTER — Other Ambulatory Visit: Payer: Self-pay

## 2022-08-12 DIAGNOSIS — I10 Essential (primary) hypertension: Secondary | ICD-10-CM

## 2022-08-12 MED ORDER — DILTIAZEM HCL ER COATED BEADS 240 MG PO CP24
240.0000 mg | ORAL_CAPSULE | Freq: Every day | ORAL | 1 refills | Status: DC
  Filled 2022-08-12 – 2022-11-11 (×2): qty 90, 90d supply, fill #0
  Filled 2023-02-07 – 2023-02-09 (×2): qty 90, 90d supply, fill #1

## 2022-08-13 ENCOUNTER — Other Ambulatory Visit: Payer: Self-pay

## 2022-08-26 ENCOUNTER — Other Ambulatory Visit: Payer: Self-pay | Admitting: Internal Medicine

## 2022-08-26 DIAGNOSIS — M170 Bilateral primary osteoarthritis of knee: Secondary | ICD-10-CM

## 2022-08-26 NOTE — Telephone Encounter (Signed)
Requested medication (s) are due for refill today: Yes  Requested medication (s) are on the active medication list: Yes  Last refill:  03/28/22  Future visit scheduled: Yes  Notes to clinic:  Manual review.    Requested Prescriptions  Pending Prescriptions Disp Refills   meloxicam (MOBIC) 7.5 MG tablet [Pharmacy Med Name: Meloxicam 7.5 MG Oral Tablet] 90 tablet 3    Sig: TAKE 1 TABLET BY MOUTH DAILY FOR ARTHRITIS PAIN     Analgesics:  COX2 Inhibitors Failed - 08/26/2022  5:40 AM      Failed - Manual Review: Labs are only required if the patient has taken medication for more than 8 weeks.      Passed - HGB in normal range and within 360 days    Hemoglobin  Date Value Ref Range Status  10/23/2021 15.3 11.1 - 15.9 g/dL Final         Passed - Cr in normal range and within 360 days    Creatinine, Ser  Date Value Ref Range Status  10/23/2021 0.84 0.57 - 1.00 mg/dL Final         Passed - HCT in normal range and within 360 days    Hematocrit  Date Value Ref Range Status  10/23/2021 46.1 34.0 - 46.6 % Final         Passed - AST in normal range and within 360 days    AST  Date Value Ref Range Status  10/23/2021 28 0 - 40 IU/L Final         Passed - ALT in normal range and within 360 days    ALT  Date Value Ref Range Status  10/23/2021 31 0 - 32 IU/L Final         Passed - eGFR is 30 or above and within 360 days    GFR calc Af Amer  Date Value Ref Range Status  01/14/2019 91 >59 mL/min/1.73 Final   GFR calc non Af Amer  Date Value Ref Range Status  01/14/2019 79 >59 mL/min/1.73 Final   eGFR  Date Value Ref Range Status  10/23/2021 78 >59 mL/min/1.73 Final         Passed - Patient is not pregnant      Passed - Valid encounter within last 12 months    Recent Outpatient Visits           3 weeks ago Type 2 diabetes mellitus with obesity (HCC)   [place] [facility] Jonah Blue B, MD   5 months ago Type 2 diabetes mellitus with  obesity [facility])   [place] [facility] Jonah Blue B, MD   10 months ago Type 2 diabetes mellitus with morbid obesity [facility])   [place] [facility] [place] Jonah Blue B, MD   1 year ago Type 2 diabetes mellitus with morbid obesity [facility])   [place] [facility] Jonah Blue B, MD   2 years ago Type 2 diabetes mellitus with morbid obesity [facility])   [place] [facility] Marcine Matar, MD       Future Appointments             In 3 months Laural Benes, Binnie Rail, MD [facility]

## 2022-09-30 ENCOUNTER — Ambulatory Visit: Payer: BC Managed Care – PPO | Attending: Internal Medicine

## 2022-09-30 DIAGNOSIS — E669 Obesity, unspecified: Secondary | ICD-10-CM | POA: Diagnosis not present

## 2022-09-30 DIAGNOSIS — E1169 Type 2 diabetes mellitus with other specified complication: Secondary | ICD-10-CM

## 2022-09-30 DIAGNOSIS — E785 Hyperlipidemia, unspecified: Secondary | ICD-10-CM | POA: Diagnosis not present

## 2022-10-01 LAB — CBC
Hematocrit: 42.8 % (ref 34.0–46.6)
Hemoglobin: 14 g/dL (ref 11.1–15.9)
MCH: 29.5 pg (ref 26.6–33.0)
MCHC: 32.7 g/dL (ref 31.5–35.7)
MCV: 90 fL (ref 79–97)
Platelets: 200 10*3/uL (ref 150–450)
RBC: 4.75 x10E6/uL (ref 3.77–5.28)
RDW: 12.3 % (ref 11.7–15.4)
WBC: 6.3 10*3/uL (ref 3.4–10.8)

## 2022-10-01 LAB — COMPREHENSIVE METABOLIC PANEL
ALT: 18 IU/L (ref 0–32)
AST: 20 IU/L (ref 0–40)
Albumin: 4.4 g/dL (ref 3.9–4.9)
Alkaline Phosphatase: 121 IU/L (ref 44–121)
BUN/Creatinine Ratio: 24 (ref 12–28)
BUN: 20 mg/dL (ref 8–27)
Bilirubin Total: <0.2 mg/dL (ref 0.0–1.2)
CO2: 25 mmol/L (ref 20–29)
Calcium: 10.1 mg/dL (ref 8.7–10.3)
Chloride: 105 mmol/L (ref 96–106)
Creatinine, Ser: 0.82 mg/dL (ref 0.57–1.00)
Globulin, Total: 2.9 g/dL (ref 1.5–4.5)
Glucose: 84 mg/dL (ref 70–99)
Potassium: 5 mmol/L (ref 3.5–5.2)
Sodium: 141 mmol/L (ref 134–144)
Total Protein: 7.3 g/dL (ref 6.0–8.5)
eGFR: 80 mL/min/{1.73_m2} (ref >59)

## 2022-10-01 LAB — LIPID PANEL
Chol/HDL Ratio: 2.5 ratio (ref 0.0–4.4)
Cholesterol, Total: 170 mg/dL (ref 100–199)
HDL: 67 mg/dL (ref >39)
LDL Chol Calc (NIH): 92 mg/dL (ref 0–99)
Triglycerides: 57 mg/dL (ref 0–149)
VLDL Cholesterol Cal: 11 mg/dL (ref 5–40)

## 2022-10-01 LAB — HEMOGLOBIN A1C
Est. average glucose Bld gHb Est-mCnc: 140 mg/dL
Hgb A1c MFr Bld: 6.5 % — ABNORMAL HIGH (ref 4.8–5.6)

## 2022-10-02 ENCOUNTER — Encounter: Payer: Self-pay | Admitting: Internal Medicine

## 2022-10-03 ENCOUNTER — Other Ambulatory Visit: Payer: Self-pay | Admitting: Internal Medicine

## 2022-10-03 DIAGNOSIS — E1169 Type 2 diabetes mellitus with other specified complication: Secondary | ICD-10-CM

## 2022-10-03 MED ORDER — ATORVASTATIN CALCIUM 20 MG PO TABS
20.0000 mg | ORAL_TABLET | Freq: Every day | ORAL | 1 refills | Status: DC
Start: 2022-10-03 — End: 2022-11-25

## 2022-11-11 ENCOUNTER — Encounter (HOSPITAL_COMMUNITY): Payer: Self-pay | Admitting: Pharmacist

## 2022-11-11 ENCOUNTER — Other Ambulatory Visit: Payer: Self-pay

## 2022-11-11 ENCOUNTER — Other Ambulatory Visit (HOSPITAL_COMMUNITY): Payer: Self-pay

## 2022-11-18 ENCOUNTER — Other Ambulatory Visit (HOSPITAL_COMMUNITY): Payer: Self-pay

## 2022-11-25 ENCOUNTER — Other Ambulatory Visit: Payer: Self-pay | Admitting: Internal Medicine

## 2022-11-25 ENCOUNTER — Other Ambulatory Visit: Payer: Self-pay

## 2022-11-25 DIAGNOSIS — I10 Essential (primary) hypertension: Secondary | ICD-10-CM

## 2022-11-25 DIAGNOSIS — E1169 Type 2 diabetes mellitus with other specified complication: Secondary | ICD-10-CM

## 2022-11-25 MED ORDER — SPIRONOLACTONE 25 MG PO TABS
25.0000 mg | ORAL_TABLET | Freq: Every day | ORAL | 0 refills | Status: DC
Start: 2022-11-25 — End: 2023-02-08
  Filled 2022-11-25 – 2022-11-26 (×3): qty 30, 30d supply, fill #0

## 2022-11-25 MED ORDER — METFORMIN HCL 500 MG PO TABS
500.0000 mg | ORAL_TABLET | Freq: Every day | ORAL | 0 refills | Status: DC
Start: 2022-11-25 — End: 2023-02-07
  Filled 2022-11-25 – 2022-11-26 (×3): qty 30, 30d supply, fill #0

## 2022-11-25 MED ORDER — ATORVASTATIN CALCIUM 20 MG PO TABS
20.0000 mg | ORAL_TABLET | Freq: Every day | ORAL | 0 refills | Status: DC
Start: 2022-11-25 — End: 2022-12-29
  Filled 2022-11-25 – 2022-11-26 (×3): qty 30, 30d supply, fill #0

## 2022-11-25 MED ORDER — CARVEDILOL 6.25 MG PO TABS
6.2500 mg | ORAL_TABLET | Freq: Two times a day (BID) | ORAL | 0 refills | Status: DC
  Filled 2022-11-25 – 2022-11-26 (×3): qty 60, 30d supply, fill #0

## 2022-11-25 MED ORDER — LOSARTAN POTASSIUM 100 MG PO TABS
100.0000 mg | ORAL_TABLET | Freq: Every day | ORAL | 0 refills | Status: DC
  Filled 2022-11-25 – 2022-11-26 (×3): qty 30, 30d supply, fill #0

## 2022-11-26 ENCOUNTER — Other Ambulatory Visit: Payer: Self-pay

## 2022-11-26 ENCOUNTER — Other Ambulatory Visit (HOSPITAL_COMMUNITY): Payer: Self-pay

## 2022-11-27 ENCOUNTER — Other Ambulatory Visit: Payer: Self-pay

## 2022-12-01 ENCOUNTER — Other Ambulatory Visit: Payer: Self-pay

## 2022-12-03 ENCOUNTER — Telehealth: Payer: Self-pay

## 2022-12-03 NOTE — Telephone Encounter (Addendum)
Called patients daughter and patient is aware that appointment has been switched to video visit.

## 2022-12-03 NOTE — Telephone Encounter (Signed)
That is fine 

## 2022-12-03 NOTE — Telephone Encounter (Signed)
Patient sent CRM message wanting to switch appointment to a MyChart video visit, please advise.

## 2022-12-03 NOTE — Telephone Encounter (Signed)
Called & LVM to call back. Please inform patient that her appointment has been changed to a video visit.

## 2022-12-04 NOTE — Telephone Encounter (Signed)
Noted  

## 2022-12-05 ENCOUNTER — Encounter: Payer: Self-pay | Admitting: Internal Medicine

## 2022-12-05 ENCOUNTER — Ambulatory Visit: Payer: Self-pay | Attending: Internal Medicine | Admitting: Internal Medicine

## 2022-12-05 VITALS — BP 127/80

## 2022-12-05 DIAGNOSIS — E1159 Type 2 diabetes mellitus with other circulatory complications: Secondary | ICD-10-CM

## 2022-12-05 DIAGNOSIS — E119 Type 2 diabetes mellitus without complications: Secondary | ICD-10-CM

## 2022-12-05 DIAGNOSIS — E669 Obesity, unspecified: Secondary | ICD-10-CM

## 2022-12-05 DIAGNOSIS — E1169 Type 2 diabetes mellitus with other specified complication: Secondary | ICD-10-CM

## 2022-12-05 DIAGNOSIS — I152 Hypertension secondary to endocrine disorders: Secondary | ICD-10-CM

## 2022-12-05 DIAGNOSIS — D6869 Other thrombophilia: Secondary | ICD-10-CM

## 2022-12-05 DIAGNOSIS — R918 Other nonspecific abnormal finding of lung field: Secondary | ICD-10-CM

## 2022-12-05 DIAGNOSIS — Z7984 Long term (current) use of oral hypoglycemic drugs: Secondary | ICD-10-CM

## 2022-12-05 DIAGNOSIS — Z5941 Food insecurity: Secondary | ICD-10-CM

## 2022-12-05 DIAGNOSIS — E785 Hyperlipidemia, unspecified: Secondary | ICD-10-CM

## 2022-12-05 NOTE — Progress Notes (Signed)
Virtual Visit via Video Note  I connected with Shannon Khan on 12/05/2022 at 9:44 a.m by a video enabled telemedicine application and verified that I am speaking with the correct person using two identifiers.  Location: Patient: pt is at daughter's home in [place]; helping to take care of her 68 mth old grand-daughter.  Provider: Office   I discussed the limitations of evaluation and management by telemedicine and the availability of in person appointments. The patient expressed understanding and agreed to proceed.  History of Present Illness: patient with history of HTN, obesity, excision of goiter, OA knees, A.fib ( Eliquis d/c by cardiology 11/2019), new DM with  and neuropathy, latent TB treated through HD 2023, thyroid nodules (neds repeat US 12/2022).    HTN/A.fib: Taking and tolerating diltiazem 240 mg daily, Cozaar 100 mg daily and spironolactone 25 mg daily and carvedilol 6.25 mg twice a day.  Limits salt intake Checks BP daily.  Last 2 readings 127/80 this a.m, yesterday 120/80 No CP/SOB/LE edema/palpitations  DM: Lab Results  Component Value Date   HGBA1C 6.5 (H) 09/30/2022  Reports compliance with taking metformin 500 mg daily. Checking blood sugars once a day before breakfast.  Range this q.m was 107.  Other recent readings 90, 100. Does well with eating habits. She walks with her grand-daughter as weather permits.  HL: Reports compliance with atorvastatin 20 mg daily.  It was increased from 10 mg  to 20 mg after last visit when her LDL came back at  92 which is not at goal.  Hx of A.fib.    On last visit, we ordered the repeat CAT scan of the chest.  Patient has not had this done.  Patient states her daughter had scheduled it but she subsequently lost insurance as her husband lost his job.  She states that she will have to defer on getting this done until she gets insurance again. She also request to know if there is any assistance in the area for she and her  husband to be able to get food as they sometimes face food insecurities since he lost his job.  HM: Due for diabetic eye exam, flu shot, second shingles vaccine, urine microalbumin, colon cancer screening.  She states she will try to get the flu vaccine and shingles vaccine in [place].  She will also try to get her eye exam done there.  She defers on colon cancer screening until she comes back to [place]. Current Outpatient Medications on File Prior to Visit  Medication Sig Dispense Refill   aspirin EC 81 MG tablet Take 1 tablet (81 mg total) by mouth daily. Swallow whole. 90 each 3   atorvastatin (LIPITOR) 20 MG tablet Take 1 tablet (20 mg total) by mouth daily. Dose increase 30 tablet 0   Blood Glucose Monitoring Suppl (ONETOUCH VERIO REFLECT) w/Device KIT 1 kit by Does not apply route 2 (two) times daily. 1 kit 0   carvedilol (COREG) 6.25 MG tablet Take 1 tablet (6.25 mg total) by mouth 2 (two) times daily with a meal. 60 tablet 0   diltiazem (CARDIZEM CD) 240 MG 24 hr capsule Take 1 capsule (240 mg total) by mouth daily. 90 capsule 1   glucose blood (ONETOUCH VERIO) test strip Use as instructed 100 each 12   losartan (COZAAR) 100 MG tablet Take 1 tablet (100 mg total) by mouth daily. 30 tablet 0   meloxicam (MOBIC) 7.5 MG tablet TAKE 1 TABLET BY MOUTH DAILY FOR ARTHRITIS PAIN 90 tablet  1   metFORMIN (GLUCOPHAGE) 500 MG tablet Take 1 tablet (500 mg total) by mouth daily with breakfast. 30 tablet 0   OneTouch Delica Lancets 33G MISC Use to check blood sugar twice daily. 100 each 11   spironolactone (ALDACTONE) 25 MG tablet Take 1 tablet (25 mg total) by mouth daily. 30 tablet 0   No current facility-administered medications on file prior to visit.  .   Observations/Objective: Older African female sitting in chair in NAD  Lab Results  Component Value Date   WBC 6.3 09/30/2022   HGB 14.0 09/30/2022   HCT 42.8 09/30/2022   MCV 90 09/30/2022   PLT 200 09/30/2022     Chemistry       Component Value Date/Time   NA 141 09/30/2022 1139   K 5.0 09/30/2022 1139   CL 105 09/30/2022 1139   CO2 25 09/30/2022 1139   BUN 20 09/30/2022 1139   CREATININE 0.82 09/30/2022 1139      Component Value Date/Time   CALCIUM 10.1 09/30/2022 1139   ALKPHOS 121 09/30/2022 1139   AST 20 09/30/2022 1139   ALT 18 09/30/2022 1139   BILITOT <0.2 09/30/2022 1139     Lab Results  Component Value Date   CHOL 170 09/30/2022   HDL 67 09/30/2022   LDLCALC 92 09/30/2022   TRIG 57 09/30/2022   CHOLHDL 2.5 09/30/2022    Assessment and Plan: 1. Type 2 diabetes mellitus with obesity (HCC) 2. Diabetes mellitus treated with oral medication (HCC) At goal.  Patient to continue metformin.  Encouraged her to continue healthy eating habits and trying to move as much as she can.  3. Hypertension associated with type 2 diabetes mellitus (HCC) At goal.  Continue diltiazem 240 mg daily, Cozaar 100 mg daily and spironolactone 25 mg daily and carvedilol 6.25 mg twice a day.   4. Hyperlipidemia associated with type 2 diabetes mellitus (HCC) Continue atorvastatin 20 mg daily.  5. Food insecurity Advised patient that her husband can go to some of the local food banks in the [place] area.  She will have her daughter look up locations of these food banks.  6. Abnormal CT scan of lung Patient defers on getting this done until she gets insurance again.  7.  Acquired thrombophilia Was on Eliquis for history of A-fib.  It was no longer required per cardiology as patient did not have any recurrence.  Follow Up Instructions: 4 mths   I discussed the assessment and treatment plan with the patient. The patient was provided an opportunity to ask questions and all were answered. The patient agreed with the plan and demonstrated an understanding of the instructions.   The patient was advised to call back or seek an in-person evaluation if the symptoms worsen or if the condition fails to improve as  anticipated.  I spent 17 minutes dedicated to the care of this patient on the date of this encounter to include previsit review of of my last note, lab results, face-to-face time with patient discussing diagnosis and management.  This note has been created with Education officer, environmental. Any transcriptional errors are unintentional.  Jonah Blue, MD

## 2022-12-29 ENCOUNTER — Other Ambulatory Visit: Payer: Self-pay | Admitting: Internal Medicine

## 2022-12-29 DIAGNOSIS — E1169 Type 2 diabetes mellitus with other specified complication: Secondary | ICD-10-CM

## 2022-12-29 MED ORDER — ATORVASTATIN CALCIUM 20 MG PO TABS
20.0000 mg | ORAL_TABLET | Freq: Every day | ORAL | 1 refills | Status: DC
  Filled 2022-12-29: qty 90, 90d supply, fill #0
  Filled 2023-02-07 – 2023-04-02 (×2): qty 90, 90d supply, fill #1

## 2022-12-30 ENCOUNTER — Other Ambulatory Visit (HOSPITAL_COMMUNITY): Payer: Self-pay

## 2022-12-30 ENCOUNTER — Other Ambulatory Visit: Payer: Self-pay

## 2023-02-07 ENCOUNTER — Other Ambulatory Visit: Payer: Self-pay | Admitting: Internal Medicine

## 2023-02-07 ENCOUNTER — Other Ambulatory Visit (HOSPITAL_COMMUNITY): Payer: Self-pay

## 2023-02-07 DIAGNOSIS — E1169 Type 2 diabetes mellitus with other specified complication: Secondary | ICD-10-CM

## 2023-02-07 DIAGNOSIS — I10 Essential (primary) hypertension: Secondary | ICD-10-CM

## 2023-02-08 MED ORDER — METFORMIN HCL 500 MG PO TABS
500.0000 mg | ORAL_TABLET | Freq: Every day | ORAL | 0 refills | Status: DC
  Filled 2023-02-08 – 2023-02-09 (×2): qty 90, 90d supply, fill #0

## 2023-02-08 MED ORDER — CARVEDILOL 6.25 MG PO TABS
6.2500 mg | ORAL_TABLET | Freq: Two times a day (BID) | ORAL | 0 refills | Status: DC
  Filled 2023-02-08 – 2023-02-09 (×2): qty 180, 90d supply, fill #0

## 2023-02-08 MED ORDER — LOSARTAN POTASSIUM 100 MG PO TABS
100.0000 mg | ORAL_TABLET | Freq: Every day | ORAL | 0 refills | Status: DC
  Filled 2023-02-08 – 2023-02-09 (×2): qty 90, 90d supply, fill #0

## 2023-02-08 MED ORDER — SPIRONOLACTONE 25 MG PO TABS
25.0000 mg | ORAL_TABLET | Freq: Every day | ORAL | 0 refills | Status: DC
Start: 2023-02-08 — End: 2023-05-05
  Filled 2023-02-08 – 2023-02-09 (×2): qty 90, 90d supply, fill #0

## 2023-02-09 ENCOUNTER — Other Ambulatory Visit: Payer: Self-pay

## 2023-02-09 ENCOUNTER — Other Ambulatory Visit: Payer: Self-pay | Admitting: Internal Medicine

## 2023-02-09 DIAGNOSIS — M170 Bilateral primary osteoarthritis of knee: Secondary | ICD-10-CM

## 2023-02-09 MED ORDER — MELOXICAM 7.5 MG PO TABS
7.5000 mg | ORAL_TABLET | Freq: Every day | ORAL | 0 refills | Status: DC
  Filled 2023-02-09: qty 90, 90d supply, fill #0

## 2023-02-10 ENCOUNTER — Other Ambulatory Visit: Payer: Self-pay

## 2023-04-02 ENCOUNTER — Other Ambulatory Visit (HOSPITAL_COMMUNITY): Payer: Self-pay

## 2023-04-02 ENCOUNTER — Other Ambulatory Visit: Payer: Self-pay

## 2023-05-05 ENCOUNTER — Other Ambulatory Visit: Payer: Self-pay | Admitting: Internal Medicine

## 2023-05-05 DIAGNOSIS — E1169 Type 2 diabetes mellitus with other specified complication: Secondary | ICD-10-CM

## 2023-05-05 DIAGNOSIS — M170 Bilateral primary osteoarthritis of knee: Secondary | ICD-10-CM

## 2023-05-05 DIAGNOSIS — I10 Essential (primary) hypertension: Secondary | ICD-10-CM

## 2023-05-06 ENCOUNTER — Other Ambulatory Visit: Payer: Self-pay

## 2023-05-06 ENCOUNTER — Other Ambulatory Visit (HOSPITAL_COMMUNITY): Payer: Self-pay

## 2023-05-06 MED ORDER — SPIRONOLACTONE 25 MG PO TABS
25.0000 mg | ORAL_TABLET | Freq: Every day | ORAL | 0 refills | Status: DC
  Filled 2023-05-06: qty 30, 30d supply, fill #0

## 2023-05-06 MED ORDER — ATORVASTATIN CALCIUM 20 MG PO TABS
20.0000 mg | ORAL_TABLET | Freq: Every day | ORAL | 0 refills | Status: DC
  Filled 2023-05-06 – 2023-06-10 (×2): qty 30, 30d supply, fill #0

## 2023-05-06 MED ORDER — LOSARTAN POTASSIUM 100 MG PO TABS
100.0000 mg | ORAL_TABLET | Freq: Every day | ORAL | 0 refills | Status: DC
  Filled 2023-05-06: qty 30, 30d supply, fill #0

## 2023-05-06 MED ORDER — MELOXICAM 7.5 MG PO TABS
7.5000 mg | ORAL_TABLET | Freq: Every day | ORAL | 0 refills | Status: DC
  Filled 2023-05-06: qty 30, 30d supply, fill #0

## 2023-05-06 MED ORDER — DILTIAZEM HCL ER COATED BEADS 240 MG PO CP24
240.0000 mg | ORAL_CAPSULE | Freq: Every day | ORAL | 0 refills | Status: DC
  Filled 2023-05-06: qty 30, 30d supply, fill #0

## 2023-05-06 MED ORDER — CARVEDILOL 6.25 MG PO TABS
6.2500 mg | ORAL_TABLET | Freq: Two times a day (BID) | ORAL | 0 refills | Status: DC
  Filled 2023-05-06: qty 60, 30d supply, fill #0

## 2023-05-06 MED ORDER — METFORMIN HCL 500 MG PO TABS
500.0000 mg | ORAL_TABLET | Freq: Every day | ORAL | 0 refills | Status: DC
  Filled 2023-05-06: qty 30, 30d supply, fill #0

## 2023-05-07 ENCOUNTER — Other Ambulatory Visit: Payer: Self-pay

## 2023-06-10 ENCOUNTER — Other Ambulatory Visit: Payer: Self-pay

## 2023-06-10 ENCOUNTER — Other Ambulatory Visit: Payer: Self-pay | Admitting: Internal Medicine

## 2023-06-10 ENCOUNTER — Other Ambulatory Visit (HOSPITAL_COMMUNITY): Payer: Self-pay

## 2023-06-10 DIAGNOSIS — M170 Bilateral primary osteoarthritis of knee: Secondary | ICD-10-CM

## 2023-06-10 DIAGNOSIS — E1169 Type 2 diabetes mellitus with other specified complication: Secondary | ICD-10-CM

## 2023-06-10 DIAGNOSIS — I10 Essential (primary) hypertension: Secondary | ICD-10-CM

## 2023-06-15 ENCOUNTER — Other Ambulatory Visit (HOSPITAL_COMMUNITY): Payer: Self-pay

## 2023-06-16 ENCOUNTER — Other Ambulatory Visit: Payer: Self-pay | Admitting: Internal Medicine

## 2023-06-16 ENCOUNTER — Telehealth: Payer: Self-pay | Admitting: Internal Medicine

## 2023-06-16 ENCOUNTER — Other Ambulatory Visit (HOSPITAL_COMMUNITY): Payer: Self-pay

## 2023-06-16 DIAGNOSIS — I10 Essential (primary) hypertension: Secondary | ICD-10-CM

## 2023-06-16 DIAGNOSIS — M170 Bilateral primary osteoarthritis of knee: Secondary | ICD-10-CM

## 2023-06-16 DIAGNOSIS — E1169 Type 2 diabetes mellitus with other specified complication: Secondary | ICD-10-CM

## 2023-06-16 MED ORDER — METFORMIN HCL 500 MG PO TABS
500.0000 mg | ORAL_TABLET | Freq: Every day | ORAL | 0 refills | Status: DC
  Filled 2023-06-16: qty 30, 30d supply, fill #0

## 2023-06-16 MED ORDER — SPIRONOLACTONE 25 MG PO TABS
25.0000 mg | ORAL_TABLET | Freq: Every day | ORAL | 0 refills | Status: DC
  Filled 2023-06-16: qty 30, 30d supply, fill #0

## 2023-06-16 MED ORDER — DILTIAZEM HCL ER COATED BEADS 240 MG PO CP24
240.0000 mg | ORAL_CAPSULE | Freq: Every day | ORAL | 0 refills | Status: DC
  Filled 2023-06-16: qty 30, 30d supply, fill #0

## 2023-06-16 MED ORDER — MELOXICAM 7.5 MG PO TABS
7.5000 mg | ORAL_TABLET | Freq: Every day | ORAL | 0 refills | Status: DC
  Filled 2023-06-16: qty 30, 30d supply, fill #0

## 2023-06-16 MED ORDER — LOSARTAN POTASSIUM 100 MG PO TABS
100.0000 mg | ORAL_TABLET | Freq: Every day | ORAL | 0 refills | Status: DC
  Filled 2023-06-16: qty 30, 30d supply, fill #0

## 2023-06-16 MED ORDER — CARVEDILOL 6.25 MG PO TABS
6.2500 mg | ORAL_TABLET | Freq: Two times a day (BID) | ORAL | 0 refills | Status: DC
  Filled 2023-06-16: qty 60, 30d supply, fill #0

## 2023-06-16 NOTE — Telephone Encounter (Unsigned)
 Copied from CRM [id number]. Topic: Clinical - Medication Refill >> Jun 16, 2023  3:37 PM Antwanette L wrote: Medication: carvedilol  (COREG ) 6.25 MG tablet,metFORMIN  (GLUCOPHAGE ) 500 MG tablet,meloxicam  (MOBIC ) 7.5 MG tablet,diltiazem  (CARDIZEM  CD) 240 MG 24 hr capsule, and losartan  (COZAAR ) 100 MG tablet   Has the patient contacted their pharmacy? Yes   This is the patient's preferred pharmacy:    [facility] [address], [address] [place] [address] Phone: [phone] Fax: [phone]    Is this the correct pharmacy for this prescription? Yes If no, delete pharmacy and type the correct one.   Has the prescription been filled recently? Yes. Last refilled on 05/06/23  Is the patient out of the medication? Yes  Has the patient been seen for an appointment in the last year OR does the patient have an upcoming appointment? Yes. Patient last appt was 12/05/22 and patient has an upcoming appt on 08/11/23  Can we respond through MyChart? Yes  Agent: Please be advised that Rx refills may take up to 3 business days. We ask that you follow-up with your pharmacy.

## 2023-06-17 ENCOUNTER — Other Ambulatory Visit (HOSPITAL_COMMUNITY): Payer: Self-pay

## 2023-06-17 ENCOUNTER — Other Ambulatory Visit: Payer: Self-pay

## 2023-06-17 MED ORDER — CARVEDILOL 6.25 MG PO TABS
6.2500 mg | ORAL_TABLET | Freq: Two times a day (BID) | ORAL | 0 refills | Status: AC
Start: 2023-06-17 — End: ?
  Filled 2023-06-17: qty 60, 30d supply, fill #0
  Filled 2023-06-17: qty 120, 60d supply, fill #0

## 2023-06-17 MED ORDER — MELOXICAM 7.5 MG PO TABS
7.5000 mg | ORAL_TABLET | Freq: Every day | ORAL | 0 refills | Status: DC
  Filled 2023-06-17: qty 30, 30d supply, fill #0
  Filled 2023-07-13: qty 30, 30d supply, fill #1

## 2023-06-17 MED ORDER — SPIRONOLACTONE 25 MG PO TABS
25.0000 mg | ORAL_TABLET | Freq: Every day | ORAL | 0 refills | Status: AC
Start: 2023-06-17 — End: ?
  Filled 2023-06-17: qty 60, 60d supply, fill #0
  Filled 2023-06-17: qty 30, 30d supply, fill #0

## 2023-06-17 MED ORDER — DILTIAZEM HCL ER COATED BEADS 240 MG PO CP24
240.0000 mg | ORAL_CAPSULE | Freq: Every day | ORAL | 0 refills | Status: DC
  Filled 2023-06-17: qty 30, 30d supply, fill #0
  Filled 2023-07-13: qty 30, 30d supply, fill #1

## 2023-06-17 MED ORDER — LOSARTAN POTASSIUM 100 MG PO TABS
100.0000 mg | ORAL_TABLET | Freq: Every day | ORAL | 0 refills | Status: DC
  Filled 2023-06-17: qty 60, 60d supply, fill #0
  Filled 2023-06-17: qty 30, 30d supply, fill #0

## 2023-06-17 MED ORDER — METFORMIN HCL 500 MG PO TABS
500.0000 mg | ORAL_TABLET | Freq: Every day | ORAL | 0 refills | Status: DC
  Filled 2023-06-17: qty 30, 30d supply, fill #0
  Filled 2023-07-13: qty 30, 30d supply, fill #1

## 2023-06-17 NOTE — Telephone Encounter (Signed)
 Rfs sent. Must keep upcoming appt for any additional Rfs.

## 2023-06-17 NOTE — Telephone Encounter (Signed)
 Called patient using a [employer] interpreter, [place] [phone]  , LVM to call back.

## 2023-06-19 NOTE — Telephone Encounter (Signed)
 Called patient using a [employer] interpreter, Robyne Christen [phone], unable to LVM.

## 2023-07-13 ENCOUNTER — Other Ambulatory Visit: Payer: Self-pay | Admitting: Internal Medicine

## 2023-07-13 ENCOUNTER — Other Ambulatory Visit (HOSPITAL_BASED_OUTPATIENT_CLINIC_OR_DEPARTMENT_OTHER): Payer: Self-pay

## 2023-07-13 ENCOUNTER — Other Ambulatory Visit (HOSPITAL_COMMUNITY): Payer: Self-pay

## 2023-07-13 ENCOUNTER — Other Ambulatory Visit: Payer: Self-pay

## 2023-07-13 DIAGNOSIS — E1169 Type 2 diabetes mellitus with other specified complication: Secondary | ICD-10-CM

## 2023-07-13 DIAGNOSIS — I10 Essential (primary) hypertension: Secondary | ICD-10-CM

## 2023-07-13 MED ORDER — CARVEDILOL 6.25 MG PO TABS
6.2500 mg | ORAL_TABLET | Freq: Two times a day (BID) | ORAL | 0 refills | Status: DC
  Filled 2023-07-13: qty 60, 30d supply, fill #0

## 2023-07-13 MED ORDER — LOSARTAN POTASSIUM 100 MG PO TABS
100.0000 mg | ORAL_TABLET | Freq: Every day | ORAL | 0 refills | Status: DC
  Filled 2023-07-13: qty 30, 30d supply, fill #0

## 2023-07-13 MED ORDER — SPIRONOLACTONE 25 MG PO TABS
25.0000 mg | ORAL_TABLET | Freq: Every day | ORAL | 0 refills | Status: DC
  Filled 2023-07-13: qty 30, 30d supply, fill #0

## 2023-07-13 MED ORDER — ATORVASTATIN CALCIUM 20 MG PO TABS
20.0000 mg | ORAL_TABLET | Freq: Every day | ORAL | 0 refills | Status: DC
  Filled 2023-07-13 (×2): qty 30, 30d supply, fill #0

## 2023-07-17 ENCOUNTER — Other Ambulatory Visit: Payer: Self-pay | Admitting: Internal Medicine

## 2023-07-17 ENCOUNTER — Other Ambulatory Visit: Payer: Self-pay

## 2023-07-17 DIAGNOSIS — M170 Bilateral primary osteoarthritis of knee: Secondary | ICD-10-CM

## 2023-07-17 DIAGNOSIS — I10 Essential (primary) hypertension: Secondary | ICD-10-CM

## 2023-07-17 DIAGNOSIS — E1169 Type 2 diabetes mellitus with other specified complication: Secondary | ICD-10-CM

## 2023-07-17 MED ORDER — DILTIAZEM HCL ER COATED BEADS 240 MG PO CP24
240.0000 mg | ORAL_CAPSULE | Freq: Every day | ORAL | 0 refills | Status: DC
  Filled 2023-07-17: qty 30, 30d supply, fill #0

## 2023-07-17 MED ORDER — METFORMIN HCL 500 MG PO TABS
500.0000 mg | ORAL_TABLET | Freq: Every day | ORAL | 0 refills | Status: DC
  Filled 2023-07-17: qty 30, 30d supply, fill #0

## 2023-07-17 MED ORDER — MELOXICAM 7.5 MG PO TABS
7.5000 mg | ORAL_TABLET | Freq: Every day | ORAL | 0 refills | Status: DC
  Filled 2023-07-17: qty 30, 30d supply, fill #0

## 2023-07-17 NOTE — Telephone Encounter (Signed)
 Patient's daughter Shannon Khan, on [place], called and advised atorvastatin , losartan , carvedilol , and spironolactone  were all sent to the pharmacy on 07/13/23. She says they were supposed to be mailed. I advised I will call the pharmacy to verify. [facility] called and spoke to [place], [facility] about the refill(s) requested. Advised it was sent on 07/13/23. She says the patient picked them all up except for atorvastatin , which was mailed out 3 days ago, on 06/17/23 a 60 day supply. So she cannot receive them right now due to this, she should have some on hand. Patient's daughter called back and advised of the above. She placed me on hold and called her mom who confirmed what the [facility] stated and that she does have all the medications and the only one's she's needing are diltiazem , metformin , and meloxicam . Advised I will send the request to the provider.   Copied from CRM [id number]. Topic: Clinical - Prescription Issue >> Jul 17, 2023  2:11 PM Shannon Khan wrote: Reason for CRM: Patients daughter states her mother is waiting on refills I asked which meds and then she said on all her meds, she requested the refills on mychart 6/16 and hasn't heard back from the office and cannot get in touch with the pharmacy. States patient is out and needs her meds to control blood pressure or it will get high.

## 2023-07-17 NOTE — Telephone Encounter (Signed)
 Requested Prescriptions  Pending Prescriptions Disp Refills   metFORMIN  (GLUCOPHAGE ) 500 MG tablet 30 tablet 0    Sig: Take 1 tablet (500 mg total) by mouth daily with breakfast.     Endocrinology:  Diabetes - Biguanides Failed - 07/17/2023  4:08 PM      Failed - HBA1C is between 0 and 7.9 and within 180 days    HbA1c, POC (controlled diabetic range)  Date Value Ref Range Status  03/28/2022 6.1 0.0 - 7.0 % Final   Hgb A1c MFr Bld  Date Value Ref Range Status  09/30/2022 6.5 (H) 4.8 - 5.6 % Final    Comment:             Prediabetes: 5.7 - 6.4          Diabetes: >6.4          Glycemic control for adults with diabetes: <7.0          Failed - B12 Level in normal range and within 720 days    Vitamin B-12  Date Value Ref Range Status  11/19/2018 913 232 - 1,245 pg/mL Final         Failed - Valid encounter within last 6 months    Recent Outpatient Visits           7 months ago Type 2 diabetes mellitus with obesity (HCC)   [facility] [place]. [facility] Concetta Dee B, MD   11 months ago Type 2 diabetes mellitus with obesity [facility])   [facility] [place]. [facility] Lawrance Presume, MD   1 year ago Type 2 diabetes mellitus with obesity [facility])   [facility] Of [place]. [facility] Concetta Dee B, MD   1 year ago Type 2 diabetes mellitus with morbid obesity [facility])   [facility] - [facility] Dept Of [place]. [facility] Concetta Dee B, MD   2 years ago Type 2 diabetes mellitus with morbid obesity [facility])   [facility] - [facility] Dept Of [place]. [facility] Concetta Dee B, MD              Failed - CBC within normal limits and completed in the last 12 months    WBC  Date Value Ref Range Status  09/30/2022 6.3 3.4 - 10.8 x10E3/uL Final  09/17/2018 9.8 4.0 - 10.5 K/uL Final   RBC  Date Value  Ref Range Status  09/30/2022 4.75 3.77 - 5.28 x10E6/uL Final  09/17/2018 4.74 3.87 - 5.11 MIL/uL Final   Hemoglobin  Date Value Ref Range Status  09/30/2022 14.0 11.1 - 15.9 g/dL Final   Hematocrit  Date Value Ref Range Status  09/30/2022 42.8 34.0 - 46.6 % Final   MCHC  Date Value Ref Range Status  09/30/2022 32.7 31.5 - 35.7 g/dL Final  16/10/9602 54.0 30.0 - 36.0 g/dL Final   [facility]  Date Value Ref Range Status  09/30/2022 29.5 26.6 - 33.0 pg Final  09/17/2018 28.9 26.0 - 34.0 pg Final   MCV  Date Value Ref Range Status  09/30/2022 90 79 - 97 fL Final   No results found for: PLTCOUNTKUC, LABPLAT, POCPLA RDW  Date Value Ref Range Status  09/30/2022 12.3 11.7 - 15.4 % Final         Passed - Cr in  normal range and within 360 days    Creatinine, Ser  Date Value Ref Range Status  09/30/2022 0.82 0.57 - 1.00 mg/dL Final         Passed - eGFR in normal range and within 360 days    GFR calc Af Amer  Date Value Ref Range Status  01/14/2019 91 >59 mL/min/1.73 Final   GFR calc non Af Amer  Date Value Ref Range Status  01/14/2019 79 >59 mL/min/1.73 Final   eGFR  Date Value Ref Range Status  09/30/2022 80 >59 mL/min/1.73 Final          diltiazem  (CARDIZEM  CD) 240 MG 24 hr capsule 30 capsule 0    Sig: Take 1 capsule (240 mg total) by mouth daily.     Cardiovascular: Calcium  Channel Blockers 3 Failed - 07/17/2023  4:08 PM      Failed - Valid encounter within last 6 months    Recent Outpatient Visits           7 months ago Type 2 diabetes mellitus with obesity (HCC)   [facility] [place]. [facility] Concetta Dee B, MD   11 months ago Type 2 diabetes mellitus with obesity [facility])   [facility] Of [place]. [facility] Lawrance Presume, MD   1 year ago Type 2 diabetes mellitus with obesity [facility])   [facility] Dept Of [place]. [facility]  Concetta Dee B, MD   1 year ago Type 2 diabetes mellitus with morbid obesity [facility])   [facility] [place]. [facility] Concetta Dee B, MD   2 years ago Type 2 diabetes mellitus with morbid obesity [facility])   [facility] - A Dept Of . [facility] Lawrance Presume, MD              Passed - ALT in normal range and within 360 days    ALT  Date Value Ref Range Status  09/30/2022 18 0 - 32 IU/L Final         Passed - AST in normal range and within 360 days    AST  Date Value Ref Range Status  09/30/2022 20 0 - 40 IU/L Final         Passed - Cr in normal range and within 360 days    Creatinine, Ser  Date Value Ref Range Status  09/30/2022 0.82 0.57 - 1.00 mg/dL Final         Passed - Last BP in normal range    BP Readings from Last 1 Encounters:  12/05/22 127/80         Passed - Last Heart Rate in normal range    Pulse Readings from Last 1 Encounters:  03/28/22 65          meloxicam  (MOBIC ) 7.5 MG tablet 30 tablet 0    Sig: Take 1 tablet (7.5 mg total) by mouth daily for arthritis pain     Analgesics:  COX2 Inhibitors Failed - 07/17/2023  4:08 PM      Failed - Manual Review: Labs are only required if the patient has taken medication for more than 8 weeks.      Passed - HGB in normal range and within 360 days    Hemoglobin  Date Value Ref Range Status  09/30/2022 14.0 11.1 - 15.9 g/dL Final         Passed - Cr in normal range and within 360 days    Creatinine, Ser  Date Value Ref Range Status  09/30/2022 0.82 0.57 - 1.00 mg/dL Final         Passed - HCT in normal range and within 360 days    Hematocrit  Date Value Ref Range Status  09/30/2022 42.8 34.0 - 46.6 % Final         Passed - AST in normal range and within 360 days    AST  Date Value Ref Range Status  09/30/2022 20 0 - 40 IU/L Final         Passed - ALT in normal range and within 360 days    ALT  Date Value  Ref Range Status  09/30/2022 18 0 - 32 IU/L Final         Passed - eGFR is 30 or above and within 360 days    GFR calc Af Amer  Date Value Ref Range Status  01/14/2019 91 >59 mL/min/1.73 Final   GFR calc non Af Amer  Date Value Ref Range Status  01/14/2019 79 >59 mL/min/1.73 Final   eGFR  Date Value Ref Range Status  09/30/2022 80 >59 mL/min/1.73 Final         Passed - Patient is not pregnant      Passed - Valid encounter within last 12 months    Recent Outpatient Visits           7 months ago Type 2 diabetes mellitus with obesity (HCC)   [facility] [place]. [facility] Concetta Dee B, MD   11 months ago Type 2 diabetes mellitus with obesity [facility])   [facility] Of [place]. [facility] Lawrance Presume, MD   1 year ago Type 2 diabetes mellitus with obesity [facility])   [facility] Dept Of [place]. [facility] Concetta Dee B, MD   1 year ago Type 2 diabetes mellitus with morbid obesity [facility])   [facility] [facility] - [facility] Dept Of [place]. [facility] Concetta Dee B, MD   2 years ago Type 2 diabetes mellitus with morbid obesity [facility])   [facility] [facility] Dept Of [place]. [facility] Lawrance Presume, MD

## 2023-07-21 ENCOUNTER — Telehealth: Payer: Self-pay | Admitting: Internal Medicine

## 2023-07-21 NOTE — Telephone Encounter (Signed)
Please update patient's chart

## 2023-07-21 NOTE — Telephone Encounter (Signed)
 Copied from CRM [id number]. Topic: General - Registration Update >> Jul 21, 2023 11:26 AM Tiffini S wrote: Patient/patient representative is calling to make an update to registration. The patient will have Medicaid with [employer] member ID: [id number] T effective date: 07/28/23.  Patient daughter is asking for call back at [phone] for appointment scheduled for 08/11/23 to confirm insurance is accepted.

## 2023-07-22 ENCOUNTER — Encounter: Payer: Self-pay | Admitting: Internal Medicine

## 2023-07-29 ENCOUNTER — Other Ambulatory Visit: Payer: Self-pay

## 2023-07-29 ENCOUNTER — Ambulatory Visit (INDEPENDENT_AMBULATORY_CARE_PROVIDER_SITE_OTHER)

## 2023-07-29 ENCOUNTER — Ambulatory Visit (HOSPITAL_COMMUNITY)
Admission: EM | Admit: 2023-07-29 | Discharge: 2023-07-29 | Disposition: A | Discharge status: Home / Self Care | Attending: Emergency Medicine | Admitting: Emergency Medicine

## 2023-07-29 ENCOUNTER — Encounter (HOSPITAL_COMMUNITY): Payer: Self-pay

## 2023-07-29 DIAGNOSIS — R059 Cough, unspecified: Secondary | ICD-10-CM

## 2023-07-29 DIAGNOSIS — R0981 Nasal congestion: Secondary | ICD-10-CM

## 2023-07-29 LAB — POCT URINALYSIS DIP (MANUAL ENTRY)
Bilirubin, UA: NEGATIVE
Glucose, UA: NEGATIVE mg/dL
Ketones, POC UA: NEGATIVE mg/dL
Nitrite, UA: NEGATIVE
Protein Ur, POC: NEGATIVE mg/dL
Spec Grav, UA: 1.02 (ref 1.010–1.025)
Urobilinogen, UA: 1 U/dL
pH, UA: 6 (ref 5.0–8.0)

## 2023-07-29 MED ORDER — PROMETHAZINE-DM 6.25-15 MG/5ML PO SYRP
5.0000 mL | ORAL_SOLUTION | Freq: Four times a day (QID) | ORAL | 0 refills | Status: DC | PRN
  Filled 2023-07-29: qty 118, 6d supply, fill #0

## 2023-07-29 MED ORDER — FLUTICASONE PROPIONATE 50 MCG/ACT NA SUSP: 1.0000 | Freq: Every day | NASAL | 0 refills | Status: DC

## 2023-07-29 MED ORDER — FLUTICASONE PROPIONATE 50 MCG/ACT NA SUSP
1.0000 | Freq: Every day | NASAL | 0 refills | Status: DC
  Filled 2023-07-29: qty 9.9, fill #0

## 2023-07-29 MED ORDER — PROMETHAZINE-DM 6.25-15 MG/5ML PO SYRP: 5.0000 mL | ORAL_SOLUTION | Freq: Four times a day (QID) | ORAL | 0 refills | Status: DC | PRN

## 2023-07-29 NOTE — ED Provider Notes (Signed)
 [facility]    CSN: [id number] Arrival date & time: 07/29/23  1037      History   Chief Complaint No chief complaint on file.   HPI Shannon Khan is a 63 y.o. female.   Husband at bedside to provide interpretation, declined medical interpretor.   Dry cough for the past month, worst at night  Cough is non-productive, no fevers  Pain in the ribs and in the back from coughing  Coughing spells can last for 5 min at a time   Has tried Robitussin and Coricidin with not much help Denies nasal drainage or congestion, does have some sinus pressure  Cough is worse when she lays down  No hx of GERD    The history is provided by the patient and medical records. The history is limited by a language barrier. A language interpreter was used (husband).    Past Medical History:  Diagnosis Date   Hypertension     Patient Active Problem List   Diagnosis Date Noted   Multiple thyroid  nodules 03/28/2022   Hyperlipidemia associated with type 2 diabetes mellitus (HCC) 08/24/2019   Type 2 diabetes mellitus with diabetic polyneuropathy, without long-term current use of insulin (HCC) 03/24/2019   Acquired thrombophilia (HCC) 03/24/2019   Primary osteoarthritis of both knees 03/24/2019   Class 3 severe obesity due to excess calories with serious comorbidity and body mass index (BMI) of 45.0 to 49.9 in adult 11/19/2018   Numbness and tingling of both feet 11/19/2018   PAF (paroxysmal atrial fibrillation) (HCC) 11/18/2018   DM (diabetes mellitus), type 2 (HCC) 10/12/2018   Imbalance 10/11/2018   On continuous oral anticoagulation 10/11/2018   Atrial fibrillation with RVR (HCC) 09/17/2018   Essential hypertension 06/14/2018   Obesity (BMI 30-39.9) 06/14/2018   History of thyroidectomy 06/14/2018   Chronic pain of both knees 06/14/2018    Past Surgical History:  Procedure Laterality Date   ORIF TIBIA & FIBULA FRACTURES Left 2012   THYROIDECTOMY  1990   for goiter    OB  History   No obstetric history on file.      Home Medications    Prior to Admission medications   Medication Sig Start Date End Date Taking? Authorizing Provider  aspirin  EC 81 MG tablet Take 1 tablet (81 mg total) by mouth daily. Swallow whole. 12/02/19  Yes Lavona Agent, MD  atorvastatin  (LIPITOR) 20 MG tablet Take 1 tablet (20 mg total) by mouth daily. 07/13/23  Yes Vicci Barnie NOVAK, MD  Blood Glucose Monitoring Suppl (ONETOUCH VERIO REFLECT) w/Device KIT 1 kit by Does not apply route 2 (two) times daily. 02/02/19  Yes Vicci Barnie NOVAK, MD  carvedilol  (COREG ) 6.25 MG tablet Take 1 tablet (6.25 mg total) by mouth 2 (two) times daily with a meal. 07/13/23  Yes Vicci Barnie NOVAK, MD  fluticasone (FLONASE) 50 MCG/ACT nasal spray Place 1 spray into both nostrils daily. 07/29/23  Yes Dreama, Sevana Grandinetti  N, FNP  glucose blood (ONETOUCH VERIO) test strip Use as instructed 02/02/19  Yes Vicci Barnie NOVAK, MD  losartan  (COZAAR ) 100 MG tablet Take 1 tablet (100 mg total) by mouth daily.Must have office visit for refills 07/13/23  Yes Vicci Barnie NOVAK, MD  meloxicam  (MOBIC ) 7.5 MG tablet Take 1 tablet (7.5 mg total) by mouth daily for arthritis pain 07/17/23  Yes Vicci Barnie NOVAK, MD  metFORMIN  (GLUCOPHAGE ) 500 MG tablet Take 1 tablet (500 mg total) by mouth daily with breakfast. 07/17/23  Yes Vicci Barnie NOVAK, MD  OneTouch Delica Lancets 33G MISC Use to check blood sugar twice daily. 02/02/19  Yes Vicci Barnie NOVAK, MD  promethazine-dextromethorphan (PROMETHAZINE-DM) 6.25-15 MG/5ML syrup Take 5 mLs by mouth 4 (four) times daily as needed for cough. 07/29/23  Yes Aminata Buffalo  N, FNP  spironolactone  (ALDACTONE ) 25 MG tablet Take 1 tablet (25 mg total) by mouth daily. 07/13/23  Yes Vicci Barnie NOVAK, MD  diltiazem  (CARDIZEM  CD) 240 MG 24 hr capsule Take 1 capsule (240 mg total) by mouth daily. 07/17/23   Vicci Barnie NOVAK, MD    Family History Family History  Problem Relation Age of Onset    Hypertension Mother    Hypertension Father    Stroke Father    Diabetes Paternal Aunt    Breast cancer Neg Hx     Social History Social History   Tobacco Use   Smoking status: Never   Smokeless tobacco: Never  Vaping Use   Vaping status: Never Used  Substance Use Topics   Alcohol use: Not Currently    Comment: occasional wine   Drug use: Never     Allergies   Patient has no known allergies.   Review of Systems Review of Systems  Per HPI  Physical Exam Triage Vital Signs ED Triage Vitals [07/29/23 1119]  Encounter Vitals Group     BP 116/78     Girls Systolic BP Percentile      Girls Diastolic BP Percentile      Boys Systolic BP Percentile      Boys Diastolic BP Percentile      Pulse Rate 70     Resp 18     Temp 98.2 F (36.8 C)     Temp Source Oral     SpO2 98 %     Weight      Height      Head Circumference      Peak Flow      Pain Score      Pain Loc      Pain Education      Exclude from Growth Chart    No data found.  Updated Vital Signs BP 116/78 (BP Location: Left Arm)   Pulse 70   Temp 98.2 F (36.8 C) (Oral)   Resp 18   SpO2 98%   Visual Acuity Right Eye Distance:   Left Eye Distance:   Bilateral Distance:    Right Eye Near:   Left Eye Near:    Bilateral Near:     Physical Exam Vitals and nursing note reviewed.  Constitutional:      Appearance: Normal appearance.  HENT:     Head: Normocephalic and atraumatic.     Right Ear: External ear normal.     Left Ear: External ear normal.     Nose: Nose normal.     Mouth/Throat:     Mouth: Mucous membranes are moist.  Eyes:     Conjunctiva/sclera: Conjunctivae normal.  Cardiovascular:     Rate and Rhythm: Normal rate and regular rhythm.     Heart sounds: Normal heart sounds. No murmur heard. Pulmonary:     Effort: Pulmonary effort is normal. No respiratory distress.     Breath sounds: Normal breath sounds. No wheezing.  Skin:    General: Skin is warm and dry.  Neurological:      General: No focal deficit present.     Mental Status: She is alert.  Psychiatric:        Mood and Affect: Mood normal.  UC Treatments / Results  Labs (all labs ordered are listed, but only abnormal results are displayed) Labs Reviewed  POCT URINALYSIS DIP (MANUAL ENTRY) - Abnormal; Notable for the following components:      Result Value   Clarity, UA cloudy (*)    Blood, UA trace-intact (*)    Leukocytes, UA Moderate (2+) (*)    All other components within normal limits    EKG   Radiology DG Chest 2 View Result Date: 07/29/2023 CLINICAL DATA:  cough x 2 months EXAM: CHEST - 2 VIEW COMPARISON:  December 06, 2021 FINDINGS: Streaky atelectasis in the right mid lung, unchanged. No focal airspace consolidation, pleural effusion, or pneumothorax. No cardiomegaly.No acute fracture or destructive lesion. Multilevel degenerative disc disease of the spine. IMPRESSION: No acute cardiopulmonary abnormality. Electronically Signed   By: Rogelia Myers M.D.   On: 07/29/2023 12:08    Procedures Procedures (including critical care time)  Medications Ordered in UC Medications - No data to display  Initial Impression / Assessment and Plan / UC Course  I have reviewed the triage vital signs and the nursing notes.  Pertinent labs & imaging results that were available during my care of the patient were reviewed by me and considered in my medical decision making (see chart for details).  Vitals in triage reviewed, patient is hemodynamically stable.  Lungs vesicular, heart with regular rate and rhythm.  Chest x-ray by my interpretation does not show acute infiltrate, confirmed with radiology overread.  Does not appear to be bacterial at this time.  Suspicious for acid reflux, cough worsening at night and with laying down.  Symptomatic management discussed, patient has been hesitant to start anything for acid reflux as she has no history of this.  Will follow-up with your primary care  provider within the next month.  Flonase for congestion.  Plan of care, follow-up care return precautions given, no questions at this time.    Final Clinical Impressions(s) / UC Diagnoses   Final diagnoses:  Cough, unspecified type  Nasal congestion     Discharge Instructions      Your chest x-ray did not show pneumonia.  You can take the cough medicine as needed, do not drink alcohol or drive on this medication as it may cause sedation.  Take it prior to bedtime to help with your cough.  Use the nasal spray to help with congestion.  Sleeping propped up on a few pillows to sleep may help if this is acid reflux.  Follow-up with your primary care provider as scheduled.     ED Prescriptions     Medication Sig Dispense Auth. Provider   promethazine-dextromethorphan (PROMETHAZINE-DM) 6.25-15 MG/5ML syrup Take 5 mLs by mouth 4 (four) times daily as needed for cough. 118 mL Coralee Edberg  N, FNP   fluticasone (FLONASE) 50 MCG/ACT nasal spray Place 1 spray into both nostrils daily. 9.9 mL Dreama, Anwar Sakata  N, FNP      PDMP not reviewed this encounter.   Dreama, Jakalyn Kratky  N, FNP 07/29/23 [phone]

## 2023-07-29 NOTE — ED Triage Notes (Signed)
 Lower back pain x 2 months. Patient has a cough x 2 months. Home Intervention: Tylenol 

## 2023-07-29 NOTE — Discharge Instructions (Signed)
 Your chest x-ray did not show pneumonia.  You can take the cough medicine as needed, do not drink alcohol or drive on this medication as it may cause sedation.  Take it prior to bedtime to help with your cough.  Use the nasal spray to help with congestion.  Sleeping propped up on a few pillows to sleep may help if this is acid reflux.  Follow-up with your primary care provider as scheduled.

## 2023-08-11 ENCOUNTER — Encounter: Payer: Self-pay | Admitting: Internal Medicine

## 2023-08-11 ENCOUNTER — Other Ambulatory Visit (HOSPITAL_COMMUNITY): Payer: Self-pay

## 2023-08-11 ENCOUNTER — Other Ambulatory Visit: Payer: Self-pay

## 2023-08-11 ENCOUNTER — Ambulatory Visit: Payer: Self-pay | Attending: Internal Medicine | Admitting: Internal Medicine

## 2023-08-11 DIAGNOSIS — Z6839 Body mass index (BMI) 39.0-39.9, adult: Secondary | ICD-10-CM | POA: Diagnosis not present

## 2023-08-11 DIAGNOSIS — E042 Nontoxic multinodular goiter: Secondary | ICD-10-CM

## 2023-08-11 DIAGNOSIS — Z1211 Encounter for screening for malignant neoplasm of colon: Secondary | ICD-10-CM

## 2023-08-11 DIAGNOSIS — E785 Hyperlipidemia, unspecified: Secondary | ICD-10-CM

## 2023-08-11 DIAGNOSIS — Z7984 Long term (current) use of oral hypoglycemic drugs: Secondary | ICD-10-CM

## 2023-08-11 DIAGNOSIS — E119 Type 2 diabetes mellitus without complications: Secondary | ICD-10-CM

## 2023-08-11 DIAGNOSIS — I152 Hypertension secondary to endocrine disorders: Secondary | ICD-10-CM

## 2023-08-11 DIAGNOSIS — I10 Essential (primary) hypertension: Secondary | ICD-10-CM | POA: Diagnosis not present

## 2023-08-11 DIAGNOSIS — M170 Bilateral primary osteoarthritis of knee: Secondary | ICD-10-CM

## 2023-08-11 DIAGNOSIS — E1169 Type 2 diabetes mellitus with other specified complication: Secondary | ICD-10-CM

## 2023-08-11 LAB — POCT GLYCOSYLATED HEMOGLOBIN (HGB A1C): HbA1c, POC (controlled diabetic range): 6.5 % (ref 0.0–7.0)

## 2023-08-11 LAB — GLUCOSE, POCT (MANUAL RESULT ENTRY): POC Glucose: 102 mg/dL — AB (ref 70–99)

## 2023-08-11 MED ORDER — METFORMIN HCL 500 MG PO TABS
500.0000 mg | ORAL_TABLET | Freq: Every day | ORAL | 2 refills | Status: AC
  Filled 2023-08-11 (×2): qty 90, 90d supply, fill #0
  Filled 2023-11-11: qty 90, 90d supply, fill #1
  Filled 2023-12-19 – 2024-01-12 (×3): qty 90, 90d supply, fill #2

## 2023-08-11 MED ORDER — LOSARTAN POTASSIUM 100 MG PO TABS
100.0000 mg | ORAL_TABLET | Freq: Every day | ORAL | 2 refills | Status: DC
  Filled 2023-08-11 (×2): qty 90, 90d supply, fill #0
  Filled 2023-11-11: qty 90, 90d supply, fill #1
  Filled 2023-12-19 – 2024-01-18 (×5): qty 90, 90d supply, fill #2

## 2023-08-11 MED ORDER — SPIRONOLACTONE 25 MG PO TABS
25.0000 mg | ORAL_TABLET | Freq: Every day | ORAL | 1 refills | Status: DC
  Filled 2023-08-11 (×2): qty 90, 90d supply, fill #0
  Filled 2023-11-11: qty 90, 90d supply, fill #1

## 2023-08-11 MED ORDER — CARVEDILOL 6.25 MG PO TABS
6.2500 mg | ORAL_TABLET | Freq: Two times a day (BID) | ORAL | 2 refills | Status: AC
  Filled 2023-08-11 (×2): qty 180, 90d supply, fill #0
  Filled 2023-11-11: qty 180, 90d supply, fill #1
  Filled 2023-12-19 – 2024-01-12 (×3): qty 180, 90d supply, fill #2

## 2023-08-11 MED ORDER — DILTIAZEM HCL ER COATED BEADS 240 MG PO CP24
240.0000 mg | ORAL_CAPSULE | Freq: Every day | ORAL | 1 refills | Status: DC
  Filled 2023-08-11 (×2): qty 90, 90d supply, fill #0
  Filled 2023-11-11: qty 90, 90d supply, fill #1

## 2023-08-11 MED ORDER — ATORVASTATIN CALCIUM 20 MG PO TABS
20.0000 mg | ORAL_TABLET | Freq: Every day | ORAL | 1 refills | Status: DC
  Filled 2023-08-11 (×2): qty 90, 90d supply, fill #0
  Filled 2023-11-11: qty 90, 90d supply, fill #1

## 2023-08-11 MED ORDER — MELOXICAM 7.5 MG PO TABS
7.5000 mg | ORAL_TABLET | Freq: Every day | ORAL | 2 refills | Status: DC
  Filled 2023-08-11 (×2): qty 90, 90d supply, fill #0
  Filled 2023-11-11: qty 90, 90d supply, fill #1
  Filled 2023-12-19: qty 90, 90d supply, fill #2

## 2023-08-11 NOTE — Progress Notes (Signed)
 Patient ID: Shannon Khan, female    DOB: 06/20/1960  MRN: [id number]  CC: Diabetes (DM f/u. Med refill. /No questions / concerns/No to all vax. )   Subjective: Shannon Khan is a 63 y.o. female who presents for chronic ds management. Daughter, Randle, is with her. Her concerns today include:  patient with history of HTN, obesity, excision of goiter, OA knees, A.fib ( Eliquis  d/c by cardiology 11/2019), new DM with  and neuropathy, latent TB treated through HD 2023, thyroid  nodules (neds repeat US  12/2022).    [employer] via phone used during this encounter. #[id number]. Daughter speaks english and provides some of the history. Pt herself speaks some english   Discussed the use of AI scribe software for clinical note transcription with the patient, who gave verbal consent to proceed.  History of Present Illness Shannon Khan is a 63 year old female with diabetes, hypertension, and hyperlipidemia who presents for follow-up.  DM: Results for orders placed or performed in visit on 08/11/23  POCT glucose (manual entry)   Collection Time: 08/11/23  2:03 PM  Result Value Ref Range   POC Glucose 102 (A) 70 - 99 mg/dl  POCT glycosylated hemoglobin (Hb A1C)   Collection Time: 08/11/23  2:22 PM  Result Value Ref Range   Hemoglobin A1C     HbA1c POC (<> result, manual entry)     HbA1c, POC (prediabetic range)     HbA1c, POC (controlled diabetic range) 6.5 0.0 - 7.0 %  Her most recent A1c is 6.5, which is the same as her previous result. She continues to take metformin  500 mg once daily.  She has experienced a weight gain of 25 pounds over the past year, increasing from 193 pounds to 218 pounds, with no clear cause identified by pt. Her diet includes vegetables twice a day, meals finished by 4 PM,  but does admit to a significant intake of rice. She avoids sugary drinks such as sodas and juices. Her physical activity is limited to climbing stairs up and down at home, and she does  not engage in structured exercise.  Hypertension, she is on diltiazem  240 mg daily, losartan  100 mg daily, carvedilol  6.25 mg twice daily, and spironolactone  25 mg daily. She experiences back pain on the right side near the breast but denies chest pain or shortness of breath.  HL: Taking and tolerating atorvastatin .  Last LDL in September 2024 was 92  Two years ago, an ultrasound of her thyroid  revealed two spots on the left side, and she was advised to have annual follow-ups for five years to monitor any changes.  Needing refill on meloxicam  which she takes for arthritis of the knees.    Patient Active Problem List   Diagnosis Date Noted   Multiple thyroid  nodules 03/28/2022   Hyperlipidemia associated with type 2 diabetes mellitus (HCC) 08/24/2019   Type 2 diabetes mellitus with diabetic polyneuropathy, without long-term current use of insulin (HCC) 03/24/2019   Acquired thrombophilia (HCC) 03/24/2019   Primary osteoarthritis of both knees 03/24/2019   Class 3 severe obesity due to excess calories with serious comorbidity and body mass index (BMI) of 45.0 to 49.9 in adult 11/19/2018   Numbness and tingling of both feet 11/19/2018   PAF (paroxysmal atrial fibrillation) (HCC) 11/18/2018   DM (diabetes mellitus), type 2 (HCC) 10/12/2018   Imbalance 10/11/2018   On continuous oral anticoagulation 10/11/2018   Atrial fibrillation with RVR (HCC) 09/17/2018   Essential hypertension 06/14/2018  Obesity (BMI 30-39.9) 06/14/2018   History of thyroidectomy 06/14/2018   Chronic pain of both knees 06/14/2018     Current Outpatient Medications on File Prior to Visit  Medication Sig Dispense Refill   aspirin  EC 81 MG tablet Take 1 tablet (81 mg total) by mouth daily. Swallow whole. 90 each 3   Blood Glucose Monitoring Suppl (ONETOUCH VERIO REFLECT) w/Device KIT 1 kit by Does not apply route 2 (two) times daily. 1 kit 0   fluticasone  (FLONASE ) 50 MCG/ACT nasal spray Place 1 spray into both  nostrils daily. 9.9 mL 0   glucose blood (ONETOUCH VERIO) test strip Use as instructed 100 each 12   OneTouch Delica Lancets 33G MISC Use to check blood sugar twice daily. 100 each 11   promethazine -dextromethorphan (PROMETHAZINE -DM) 6.25-15 MG/5ML syrup Take 5 mLs by mouth 4 (four) times daily as needed for cough. 118 mL 0   No current facility-administered medications on file prior to visit.    No Known Allergies  Social History   Socioeconomic History   Marital status: Single    Spouse name: Not on file   Number of children: 3   Years of education: completed high school   Highest education level: 12th grade  Occupational History   Not on file  Tobacco Use   Smoking status: Never   Smokeless tobacco: Never  Vaping Use   Vaping status: Never Used  Substance and Sexual Activity   Alcohol use: Not Currently    Comment: occasional wine   Drug use: Never   Sexual activity: Yes  Other Topics Concern   Not on file  Social History Narrative   Not on file   Social Drivers of Health   Financial Resource Strain: High Risk (12/01/2022)   Overall Financial Resource Strain (CARDIA)    Difficulty of Paying Living Expenses: Hard  Food Insecurity: Food Insecurity Present (12/01/2022)   Hunger Vital Sign    Worried About Running Out of Food in the Last Year: Sometimes true    Ran Out of Food in the Last Year: Sometimes true  Transportation Needs: No Transportation Needs (12/01/2022)   PRAPARE - Administrator, Civil Service (Medical): No    Lack of Transportation (Non-Medical): No  Physical Activity: Unknown (12/01/2022)   Exercise Vital Sign    Days of Exercise per Week: 0 days    Minutes of Exercise per Session: Not on file  Stress: No Stress Concern Present (12/01/2022)   [employer] of Occupational Health - Occupational Stress Questionnaire    Feeling of Stress : Not at all  Social Connections: Moderately Integrated (12/01/2022)   Social Connection and Isolation  Panel    Frequency of Communication with Friends and Family: More than three times a week    Frequency of Social Gatherings with Friends and Family: More than three times a week    Attends Religious Services: More than 4 times per year    Active Member of [employer] or Organizations: No    Attends Engineer, structural: Not on file    Marital Status: Married  Catering manager Violence: Not on file    Family History  Problem Relation Age of Onset   Hypertension Mother    Hypertension Father    Stroke Father    Diabetes Paternal Aunt    Breast cancer Neg Hx     Past Surgical History:  Procedure Laterality Date   ORIF TIBIA & FIBULA FRACTURES Left 2012   THYROIDECTOMY  1990  for goiter    ROS: Review of Systems Negative except as stated above  PHYSICAL EXAM: BP 115/71 (BP Location: Left Arm, Patient Position: Sitting, Cuff Size: Normal)   Pulse 61   Temp 98.7 F (37.1 C) (Oral)   Ht 5' 2 (1.575 m)   Wt 218 lb (98.9 kg)   SpO2 98%   BMI 39.87 kg/m   Wt Readings from Last 3 Encounters:  08/11/23 218 lb (98.9 kg)  08/01/22 193 lb (87.5 kg)  03/28/22 202 lb (91.6 kg)    Physical Exam  General appearance - alert, well appearing, obese older African female and in no distress Mental status - normal mood, behavior, speech, dress, motor activity, and thought processes Neck - supple, no significant adenopathy, thyroid  slightly enlarged BL Chest - clear to auscultation, no wheezes, rales or rhonchi, symmetric air entry Heart - normal rate, regular rhythm, normal S1, S2, no murmurs, rubs, clicks or gallops Extremities - peripheral pulses normal, no pedal edema, no clubbing or cyanosis Diabetic Foot Exam - Simple   Simple Foot Form Diabetic Foot exam was performed with the following findings: Yes 08/11/2023  2:41 PM  Visual Inspection No deformities, no ulcerations, no other skin breakdown bilaterally: Yes Sensation Testing Intact to touch and monofilament testing  bilaterally: Yes Pulse Check Posterior Tibialis and Dorsalis pulse intact bilaterally: Yes Comments         Latest Ref Rng & Units 09/30/2022   11:39 AM 12/27/2021   10:21 AM 10/23/2021    8:38 AM  CMP  Glucose 70 - 99 mg/dL 84   889   BUN 8 - 27 mg/dL 20   15   Creatinine 9.42 - 1.00 mg/dL 9.17   9.15   Sodium 865 - 144 mmol/L 141   140   Potassium 3.5 - 5.2 mmol/L 5.0   4.7   Chloride 96 - 106 mmol/L 105   101   CO2 20 - 29 mmol/L 25   24   Calcium  8.7 - 10.3 mg/dL 89.8  89.4  89.1   Total Protein 6.0 - 8.5 g/dL 7.3   7.9   Total Bilirubin 0.0 - 1.2 mg/dL <9.7   0.3   Alkaline Phos 44 - 121 IU/L 121   114   AST 0 - 40 IU/L 20   28   ALT 0 - 32 IU/L 18   31    Lipid Panel     Component Value Date/Time   CHOL 170 09/30/2022 1139   TRIG 57 09/30/2022 1139   HDL 67 09/30/2022 1139   CHOLHDL 2.5 09/30/2022 1139   LDLCALC 92 09/30/2022 1139    CBC    Component Value Date/Time   WBC 6.3 09/30/2022 1139   WBC 9.8 09/17/2018 0337   RBC 4.75 09/30/2022 1139   RBC 4.74 09/17/2018 0337   HGB 14.0 09/30/2022 1139   HCT 42.8 09/30/2022 1139   PLT 200 09/30/2022 1139   MCV 90 09/30/2022 1139   MCH 29.5 09/30/2022 1139   MCH 28.9 09/17/2018 0337   MCHC 32.7 09/30/2022 1139   MCHC 33.8 09/17/2018 0337   RDW 12.3 09/30/2022 1139   LYMPHSABS 3.5 (H) 10/11/2018 1127   MONOABS 0.5 09/17/2018 0337   EOSABS 0.1 10/11/2018 1127   BASOSABS 0.1 10/11/2018 1127    ASSESSMENT AND PLAN: 1. Type 2 diabetes mellitus with morbid obesity (HCC) (Primary) - At goal. Continue metformin  500 mg once daily. - Advise reducing intake of white starches such as rice, potatoes,  and bread. - POCT glycosylated hemoglobin (Hb A1C) - POCT glucose (manual entry) - metFORMIN  (GLUCOPHAGE ) 500 MG tablet; Take 1 tablet (500 mg total) by mouth daily with breakfast.  Dispense: 90 tablet; Refill: 2 - CBC - Comprehensive metabolic panel with GFR - Microalbumin / creatinine urine ratio - Ambulatory  referral to Ophthalmology  2. Diabetes mellitus treated with oral medication (HCC) See #1 above  3. Hyperlipidemia associated with type 2 diabetes mellitus (HCC) Continue atorvastatin .  Due for lipid check today - atorvastatin  (LIPITOR) 20 MG tablet; Take 1 tablet (20 mg total) by mouth daily.  Dispense: 90 tablet; Refill: 1 - Lipid panel  4. Hypertension associated with type 2 diabetes mellitus (HCC) At goal.  Continue carvedilol , diltiazem , Cozaar  and spironolactone  - carvedilol  (COREG ) 6.25 MG tablet; Take 1 tablet (6.25 mg total) by mouth 2 (two) times daily with a meal.  Dispense: 180 tablet; Refill: 2 - diltiazem  (CARDIZEM  CD) 240 MG 24 hr capsule; Take 1 capsule (240 mg total) by mouth daily.  Dispense: 90 capsule; Refill: 1 - losartan  (COZAAR ) 100 MG tablet; Take 1 tablet (100 mg total) by mouth daily.Must have office visit for refills  Dispense: 90 tablet; Refill: 2 - spironolactone  (ALDACTONE ) 25 MG tablet; Take 1 tablet (25 mg total) by mouth daily.  Dispense: 90 tablet; Refill: 1  5. Multiple thyroid  nodules - US  THYROID ; Future  6. Primary osteoarthritis of both knees - meloxicam  (MOBIC ) 7.5 MG tablet; Take 1 tablet (7.5 mg total) by mouth daily for arthritis pain  Dispense: 90 tablet; Refill: 2  7. Screening for colon cancer - Fecal occult blood, imunochemical([facility])    Patient was given the opportunity to ask questions.  Patient verbalized understanding of the plan and was able to repeat key elements of the plan.   This documentation was completed using Paediatric nurse.  Any transcriptional errors are unintentional.  Orders Placed This Encounter  Procedures   Fecal occult blood, imunochemical([facility])   US  THYROID    CBC   Comprehensive metabolic panel with GFR   Lipid panel   Microalbumin / creatinine urine ratio   Ambulatory referral to Ophthalmology   POCT glycosylated hemoglobin (Hb A1C)   POCT glucose (manual entry)      Requested Prescriptions   Signed Prescriptions Disp Refills   atorvastatin  (LIPITOR) 20 MG tablet 90 tablet 1    Sig: Take 1 tablet (20 mg total) by mouth daily.   carvedilol  (COREG ) 6.25 MG tablet 180 tablet 2    Sig: Take 1 tablet (6.25 mg total) by mouth 2 (two) times daily with a meal.   diltiazem  (CARDIZEM  CD) 240 MG 24 hr capsule 90 capsule 1    Sig: Take 1 capsule (240 mg total) by mouth daily.   losartan  (COZAAR ) 100 MG tablet 90 tablet 2    Sig: Take 1 tablet (100 mg total) by mouth daily.Must have office visit for refills   meloxicam  (MOBIC ) 7.5 MG tablet 90 tablet 2    Sig: Take 1 tablet (7.5 mg total) by mouth daily for arthritis pain   metFORMIN  (GLUCOPHAGE ) 500 MG tablet 90 tablet 2    Sig: Take 1 tablet (500 mg total) by mouth daily with breakfast.   spironolactone  (ALDACTONE ) 25 MG tablet 90 tablet 1    Sig: Take 1 tablet (25 mg total) by mouth daily.    Return in about 4 months (around 12/12/2023).  Barnie Louder, MD, FACP

## 2023-08-11 NOTE — Patient Instructions (Signed)
 VISIT SUMMARY:  Today, you came in for a follow-up visit to manage your diabetes, hypertension, and hyperlipidemia. We reviewed your recent lab results, discussed your weight gain, and addressed your current medications and lifestyle habits.  YOUR PLAN:  -TYPE 2 DIABETES MELLITUS: Your A1c level is 6.5%, which means your diabetes is well-controlled. However, your weight gain and high intake of white starches like rice may affect your diabetes management. Continue taking metformin  500 mg once daily, and try to reduce your intake of white starches such as rice, potatoes, and bread.  -HYPERTENSION: Your blood pressure is well-controlled with your current medications. You mentioned experiencing right-sided back pain near your breast, but you do not have chest pain or shortness of breath. Continue taking your current medications: diltiazem , losartan , carvedilol , and spironolactone .  -THYROID  NODULES: You have two thyroid  nodules on the left side that need to be monitored annually. We will order a thyroid  ultrasound to check for any changes.  -GENERAL HEALTH MAINTENANCE: Routine health maintenance for diabetes includes annual eye exams, colon cancer screening, and urine protein screening. We will submit a referral for a diabetic eye exam, provide you with a stool test kit for colon cancer screening, and perform a urine protein screening.  INSTRUCTIONS:  We will perform blood tests to check your cholesterol, kidney, and liver function. Please provide a urine sample today or take the kit home and return it the same day it is used.

## 2023-08-12 ENCOUNTER — Ambulatory Visit: Payer: Self-pay | Admitting: Internal Medicine

## 2023-08-13 LAB — COMPREHENSIVE METABOLIC PANEL WITH GFR
ALT: 24 IU/L (ref 0–32)
AST: 27 IU/L (ref 0–40)
Albumin: 4.6 g/dL (ref 3.9–4.9)
Alkaline Phosphatase: 121 IU/L (ref 44–121)
BUN/Creatinine Ratio: 20 (ref 12–28)
BUN: 19 mg/dL (ref 8–27)
Bilirubin Total: 0.3 mg/dL (ref 0.0–1.2)
CO2: 19 mmol/L — ABNORMAL LOW (ref 20–29)
Calcium: 10.5 mg/dL — ABNORMAL HIGH (ref 8.7–10.3)
Chloride: 102 mmol/L (ref 96–106)
Creatinine, Ser: 0.94 mg/dL (ref 0.57–1.00)
Globulin, Total: 3.2 g/dL (ref 1.5–4.5)
Glucose: 94 mg/dL (ref 70–99)
Potassium: 4.9 mmol/L (ref 3.5–5.2)
Sodium: 140 mmol/L (ref 134–144)
Total Protein: 7.8 g/dL (ref 6.0–8.5)
eGFR: 67 mL/min/1.73 (ref >59)

## 2023-08-13 LAB — LIPID PANEL
Chol/HDL Ratio: 2.5 ratio (ref 0.0–4.4)
Cholesterol, Total: 157 mg/dL (ref 100–199)
HDL: 64 mg/dL (ref >39)
LDL Chol Calc (NIH): 80 mg/dL (ref 0–99)
Triglycerides: 65 mg/dL (ref 0–149)
VLDL Cholesterol Cal: 13 mg/dL (ref 5–40)

## 2023-08-13 LAB — CBC
Hematocrit: 46.1 % (ref 34.0–46.6)
Hemoglobin: 14.8 g/dL (ref 11.1–15.9)
MCH: 29.6 pg (ref 26.6–33.0)
MCHC: 32.1 g/dL (ref 31.5–35.7)
MCV: 92 fL (ref 79–97)
Platelets: 220 x10E3/uL (ref 150–450)
RBC: 5 x10E6/uL (ref 3.77–5.28)
RDW: 12.4 % (ref 11.7–15.4)
WBC: 7.7 x10E3/uL (ref 3.4–10.8)

## 2023-08-13 LAB — MICROALBUMIN / CREATININE URINE RATIO
Creatinine, Urine: 171.6 mg/dL
Microalb/Creat Ratio: 34 mg/g{creat} — AB (ref 0–29)
Microalbumin, Urine: 58.1 ug/mL

## 2023-08-17 ENCOUNTER — Ambulatory Visit

## 2023-08-18 LAB — FECAL OCCULT BLOOD, IMMUNOCHEMICAL: Fecal Occult Bld: NEGATIVE

## 2023-11-11 ENCOUNTER — Other Ambulatory Visit: Payer: Self-pay

## 2023-11-11 ENCOUNTER — Other Ambulatory Visit (HOSPITAL_COMMUNITY): Payer: Self-pay

## 2023-12-14 ENCOUNTER — Encounter: Payer: Self-pay | Admitting: Internal Medicine

## 2023-12-14 ENCOUNTER — Ambulatory Visit: Attending: Internal Medicine | Admitting: Internal Medicine

## 2023-12-14 ENCOUNTER — Other Ambulatory Visit: Payer: Self-pay

## 2023-12-14 DIAGNOSIS — E1169 Type 2 diabetes mellitus with other specified complication: Secondary | ICD-10-CM | POA: Diagnosis not present

## 2023-12-14 DIAGNOSIS — I152 Hypertension secondary to endocrine disorders: Secondary | ICD-10-CM

## 2023-12-14 DIAGNOSIS — E785 Hyperlipidemia, unspecified: Secondary | ICD-10-CM

## 2023-12-14 DIAGNOSIS — Z23 Encounter for immunization: Secondary | ICD-10-CM | POA: Diagnosis not present

## 2023-12-14 DIAGNOSIS — Z1231 Encounter for screening mammogram for malignant neoplasm of breast: Secondary | ICD-10-CM

## 2023-12-14 DIAGNOSIS — Z6839 Body mass index (BMI) 39.0-39.9, adult: Secondary | ICD-10-CM

## 2023-12-14 DIAGNOSIS — Z7984 Long term (current) use of oral hypoglycemic drugs: Secondary | ICD-10-CM

## 2023-12-14 DIAGNOSIS — E1159 Type 2 diabetes mellitus with other circulatory complications: Secondary | ICD-10-CM

## 2023-12-14 DIAGNOSIS — I10 Essential (primary) hypertension: Secondary | ICD-10-CM

## 2023-12-14 DIAGNOSIS — E042 Nontoxic multinodular goiter: Secondary | ICD-10-CM

## 2023-12-14 DIAGNOSIS — E119 Type 2 diabetes mellitus without complications: Secondary | ICD-10-CM

## 2023-12-14 LAB — POCT GLYCOSYLATED HEMOGLOBIN (HGB A1C): HbA1c, POC (controlled diabetic range): 6.4 % (ref 0.0–7.0)

## 2023-12-14 NOTE — Patient Instructions (Signed)
  VISIT SUMMARY: You had a follow-up visit to review your current health status and medications. Your diabetes and hypertension are well-controlled, and your cholesterol levels are being managed effectively. We discussed the need for a follow-up ultrasound for your thyroid  nodules and routine health maintenance tests.  YOUR PLAN: -TYPE 2 DIABETES MELLITUS: Your diabetes is well-controlled with an A1c of 6.4%, which is below the target level. Continue taking metformin  500 mg daily. Maintain your dietary modifications by reducing sugary drinks and snacks and increasing your vegetable intake. Keep up with your regular physical activity, such as walking and using the stairs.  -HYPERTENSION: Your blood pressure is well-controlled at 110/70 mmHg. Continue taking your current medications: spironolactone  25 mg daily, losartan  100 mg daily, diltiazem  240 mg daily, and carvedilol  6.25 mg twice daily.  -HYPERLIPIDEMIA: Your cholesterol is being managed with atorvastatin , and your current level is 80 mg/dL, with a goal of less than 70 mg/dL. Continue taking atorvastatin  as prescribed.  -NONTOXIC MULTINODULAR GOITER: This condition involves the presence of multiple non-cancerous nodules in the thyroid  gland. You need a follow-up ultrasound for your thyroid  nodules, which has been reordered at [facility] imaging.  -GENERAL HEALTH MAINTENANCE: Routine health maintenance is due. A mammogram has been ordered, a referral for an annual eye exam has been submitted, and a Pap smear has been scheduled in six weeks.  INSTRUCTIONS: Please schedule your follow-up ultrasound for the thyroid  nodules at [facility] imaging. Additionally, ensure you complete the mammogram, annual eye exam, and Pap smear as scheduled.                      Contains text generated by Abridge.                                 Contains text generated by Abridge.

## 2023-12-14 NOTE — Progress Notes (Signed)
 Patient ID: Shannon Khan, female    DOB: Jan 31, 1960  MRN: [id number]  CC: Diabetes (DM f/u./Requesting refill for lancets & test strips/Flu & shingles vax administered on 12/14/23 - C.A.)   Subjective: Shannon Khan is a 63 y.o. female who presents for chronic ds management. Daughter who speaks english is with her. Her concerns today include:  patient with history of HTN, obesity, excision of goiter, OA knees, A.fib ( Eliquis  d/c by cardiology 11/2019), DM with  and neuropathy, latent TB treated through HD 2023, thyroid  nodules (needs repeat US  12/2022).    [employer] phone interpreter used during this encounter. #[id number] Kwaku  Discussed the use of AI scribe software for clinical note transcription with the patient, who gave verbal consent to proceed.  History of Present Illness   Shannon Khan is a 63 year old female who presents for a follow-up visit.  HTN: She adheres to her prescribed medications, including spironolactone  25 mg daily, lorsetin 100 mg daily, diltiazem  240 mg daily, and carvedilol  6.25 mg twice daily. No chest pain, shortness of breath, headaches, or dizziness.  DM:  Results for orders placed or performed in visit on 12/14/23  POCT glycosylated hemoglobin (Hb A1C)   Collection Time: 12/14/23  2:00 PM  Result Value Ref Range   Hemoglobin A1C     HbA1c POC (<> result, manual entry)     HbA1c, POC (prediabetic range)     HbA1c, POC (controlled diabetic range) 6.4 0.0 - 7.0 %  She continues metformin  500 mg daily. Her diet includes a high intake of vegetables and wheat bread, with limited sugar in her tea and reduced rice portions. She engages in regular physical activity, walking and using her staircase, especially during colder weather, typically going up and down three times in the morning before showering.  She is on atorvastatin  for cholesterol management. Her cholesterol level was last checked four months ago and was 80, with a goal of less than  70.  Thyroid  US  ordered on last visit to f/u on nodules. She has not been contacted to schedule.     HM: Due for eye exam and MMG  Patient Active Problem List   Diagnosis Date Noted   Multiple thyroid  nodules 03/28/2022   Hyperlipidemia associated with type 2 diabetes mellitus (HCC) 08/24/2019   Type 2 diabetes mellitus with diabetic polyneuropathy, without long-term current use of insulin (HCC) 03/24/2019   Acquired thrombophilia 03/24/2019   Primary osteoarthritis of both knees 03/24/2019   Class 3 severe obesity due to excess calories with serious comorbidity and body mass index (BMI) of 45.0 to 49.9 in adult [facility]) 11/19/2018   Numbness and tingling of both feet 11/19/2018   PAF (paroxysmal atrial fibrillation) (HCC) 11/18/2018   DM (diabetes mellitus), type 2 (HCC) 10/12/2018   Imbalance 10/11/2018   On continuous oral anticoagulation 10/11/2018   Atrial fibrillation with RVR (HCC) 09/17/2018   Essential hypertension 06/14/2018   Obesity (BMI 30-39.9) 06/14/2018   History of thyroidectomy 06/14/2018   Chronic pain of both knees 06/14/2018     Current Outpatient Medications on File Prior to Visit  Medication Sig Dispense Refill   aspirin  EC 81 MG tablet Take 1 tablet (81 mg total) by mouth daily. Swallow whole. 90 each 3   atorvastatin  (LIPITOR) 20 MG tablet Take 1 tablet (20 mg total) by mouth daily. 90 tablet 1   Blood Glucose Monitoring Suppl (ONETOUCH VERIO REFLECT) w/Device KIT 1 kit by Does not apply route 2 (two) times daily.  1 kit 0   carvedilol  (COREG ) 6.25 MG tablet Take 1 tablet (6.25 mg total) by mouth 2 (two) times daily with a meal. 180 tablet 2   diltiazem  (CARDIZEM  CD) 240 MG 24 hr capsule Take 1 capsule (240 mg total) by mouth daily. 90 capsule 1   fluticasone  (FLONASE ) 50 MCG/ACT nasal spray Place 1 spray into both nostrils daily. 9.9 mL 0   glucose blood (ONETOUCH VERIO) test strip Use as instructed 100 each 12   losartan  (COZAAR ) 100 MG tablet Take 1 tablet  (100 mg total) by mouth daily.Must have office visit for refills 90 tablet 2   meloxicam  (MOBIC ) 7.5 MG tablet Take 1 tablet (7.5 mg total) by mouth daily for arthritis pain 90 tablet 2   metFORMIN  (GLUCOPHAGE ) 500 MG tablet Take 1 tablet (500 mg total) by mouth daily with breakfast. 90 tablet 2   OneTouch Delica Lancets 33G MISC Use to check blood sugar twice daily. 100 each 11   promethazine -dextromethorphan (PROMETHAZINE -DM) 6.25-15 MG/5ML syrup Take 5 mLs by mouth 4 (four) times daily as needed for cough. 118 mL 0   spironolactone  (ALDACTONE ) 25 MG tablet Take 1 tablet (25 mg total) by mouth daily. 90 tablet 1   No current facility-administered medications on file prior to visit.    No Known Allergies  Social History   Socioeconomic History   Marital status: Single    Spouse name: Not on file   Number of children: 3   Years of education: completed high school   Highest education level: 12th grade  Occupational History   Not on file  Tobacco Use   Smoking status: Never   Smokeless tobacco: Never  Vaping Use   Vaping status: Never Used  Substance and Sexual Activity   Alcohol use: Not Currently    Comment: occasional wine   Drug use: Never   Sexual activity: Yes  Other Topics Concern   Not on file  Social History Narrative   Not on file   Social Drivers of Health   Financial Resource Strain: High Risk (12/14/2023)   Overall Financial Resource Strain (CARDIA)    Difficulty of Paying Living Expenses: Hard  Food Insecurity: No Food Insecurity (12/14/2023)   Hunger Vital Sign    Worried About Running Out of Food in the Last Year: Never true    Ran Out of Food in the Last Year: Never true  Transportation Needs: No Transportation Needs (12/14/2023)   PRAPARE - Administrator, Civil Service (Medical): No    Lack of Transportation (Non-Medical): No  Physical Activity: Inactive (12/14/2023)   Exercise Vital Sign    Days of Exercise per Week: 0 days    Minutes  of Exercise per Session: 0 min  Stress: No Stress Concern Present (12/14/2023)   [employer] of Occupational Health - Occupational Stress Questionnaire    Feeling of Stress: Not at all  Social Connections: Moderately Integrated (12/14/2023)   Social Connection and Isolation Panel    Frequency of Communication with Friends and Family: More than three times a week    Frequency of Social Gatherings with Friends and Family: More than three times a week    Attends Religious Services: More than 4 times per year    Active Member of [employer] or Organizations: No    Attends Banker Meetings: Never    Marital Status: Married  Catering Manager Violence: Not At Risk (12/14/2023)   Humiliation, Afraid, Rape, and Kick questionnaire  Fear of Current or Ex-Partner: No    Emotionally Abused: No    Physically Abused: No    Sexually Abused: No    Family History  Problem Relation Age of Onset   Hypertension Mother    Hypertension Father    Stroke Father    Diabetes Paternal Aunt    Breast cancer Neg Hx     Past Surgical History:  Procedure Laterality Date   ORIF TIBIA & FIBULA FRACTURES Left 2012   THYROIDECTOMY  1990   for goiter    ROS: Review of Systems Negative except as stated above  PHYSICAL EXAM: BP 110/70 (BP Location: Left Arm, Patient Position: Sitting, Cuff Size: Normal)   Pulse 75   Temp 98.4 F (36.9 C) (Oral)   Ht 5' 2 (1.575 m)   Wt 216 lb (98 kg)   SpO2 97%   BMI 39.51 kg/m   Wt Readings from Last 3 Encounters:  12/14/23 216 lb (98 kg)  08/11/23 218 lb (98.9 kg)  08/01/22 193 lb (87.5 kg)    Physical Exam  General appearance - alert, well appearing, older female and in no distress Mental status - normal mood, behavior, speech, dress, motor activity, and thought processes Neck - supple, no significant adenopathy Chest - clear to auscultation, no wheezes, rales or rhonchi, symmetric air entry Heart - normal rate, regular rhythm, normal S1,  S2, no murmurs, rubs, clicks or gallops Extremities - peripheral pulses normal, no pedal edema, no clubbing or cyanosis  Results for orders placed or performed in visit on 12/14/23  POCT glycosylated hemoglobin (Hb A1C)   Collection Time: 12/14/23  2:00 PM  Result Value Ref Range   Hemoglobin A1C     HbA1c POC (<> result, manual entry)     HbA1c, POC (prediabetic range)     HbA1c, POC (controlled diabetic range) 6.4 0.0 - 7.0 %       Latest Ref Rng & Units 08/11/2023    2:53 PM 09/30/2022   11:39 AM 12/27/2021   10:21 AM  CMP  Glucose 70 - 99 mg/dL 94  84    BUN 8 - 27 mg/dL 19  20    Creatinine 9.42 - 1.00 mg/dL 9.05  9.17    Sodium 865 - 144 mmol/L 140  141    Potassium 3.5 - 5.2 mmol/L 4.9  5.0    Chloride 96 - 106 mmol/L 102  105    CO2 20 - 29 mmol/L 19  25    Calcium  8.7 - 10.3 mg/dL 89.4  89.8  89.4   Total Protein 6.0 - 8.5 g/dL 7.8  7.3    Total Bilirubin 0.0 - 1.2 mg/dL 0.3  <9.7    Alkaline Phos 44 - 121 IU/L 121  121    AST 0 - 40 IU/L 27  20    ALT 0 - 32 IU/L 24  18     Lipid Panel     Component Value Date/Time   CHOL 157 08/11/2023 1453   TRIG 65 08/11/2023 1453   HDL 64 08/11/2023 1453   CHOLHDL 2.5 08/11/2023 1453   LDLCALC 80 08/11/2023 1453    CBC    Component Value Date/Time   WBC 7.7 08/11/2023 1453   WBC 9.8 09/17/2018 0337   RBC 5.00 08/11/2023 1453   RBC 4.74 09/17/2018 0337   HGB 14.8 08/11/2023 1453   HCT 46.1 08/11/2023 1453   PLT 220 08/11/2023 1453   MCV 92 08/11/2023 1453  MCH 29.6 08/11/2023 1453   MCH 28.9 09/17/2018 0337   MCHC 32.1 08/11/2023 1453   MCHC 33.8 09/17/2018 0337   RDW 12.4 08/11/2023 1453   LYMPHSABS 3.5 (H) 10/11/2018 1127   MONOABS 0.5 09/17/2018 0337   EOSABS 0.1 10/11/2018 1127   BASOSABS 0.1 10/11/2018 1127    ASSESSMENT AND PLAN: 1. Type 2 diabetes mellitus with morbid obesity (HCC) (Primary) Obesity associated with HTN and hyperlipidemia At goal. Continue metformin  500 mg once daily. Continue  healthy eating and regular exercise - POCT glycosylated hemoglobin (Hb A1C) - Ambulatory referral to Ophthalmology  2. Diabetes mellitus treated with oral medication (HCC) See #1 above  3. Hypertension associated with type 2 diabetes mellitus (HCC) At goal. Continue carvedilol , diltiazem , Cozaar  and spironolactone    4. Hyperlipidemia associated with type 2 diabetes mellitus (HCC) Continue atorvastatin  20 mg daily  5. Multiple thyroid  nodules I will resubmit order for US  - US  THYROID ; Future  6. Need for influenza vaccination given  7. Need for shingles vaccine Shingrix  given today  8. Encounter for screening mammogram for malignant neoplasm of breast - MM 3D SCREENING MAMMOGRAM BILATERAL BREAST; Future  Patient was given the opportunity to ask questions.  Patient verbalized understanding of the plan and was able to repeat key elements of the plan.   This documentation was completed using Paediatric nurse.  Any transcriptional errors are unintentional.  Orders Placed This Encounter  Procedures   MM 3D SCREENING MAMMOGRAM BILATERAL BREAST   US  THYROID    Flu vaccine HIGH DOSE PF(Fluzone Trivalent)   Varicella-zoster vaccine IM   Ambulatory referral to Ophthalmology   POCT glycosylated hemoglobin (Hb A1C)     Requested Prescriptions    No prescriptions requested or ordered in this encounter    Return in about 6 weeks (around 01/25/2024) for PAP.  Barnie Louder, MD, FACP

## 2023-12-19 ENCOUNTER — Other Ambulatory Visit: Payer: Self-pay | Admitting: Internal Medicine

## 2023-12-19 DIAGNOSIS — E1159 Type 2 diabetes mellitus with other circulatory complications: Secondary | ICD-10-CM

## 2023-12-19 DIAGNOSIS — E1169 Type 2 diabetes mellitus with other specified complication: Secondary | ICD-10-CM

## 2023-12-20 MED ORDER — ATORVASTATIN CALCIUM 20 MG PO TABS
20.0000 mg | ORAL_TABLET | Freq: Every day | ORAL | 1 refills | Status: AC
  Filled 2023-12-20 – 2024-01-12 (×3): qty 90, 90d supply, fill #0

## 2023-12-20 MED ORDER — SPIRONOLACTONE 25 MG PO TABS
25.0000 mg | ORAL_TABLET | Freq: Every day | ORAL | 1 refills | Status: AC
  Filled 2023-12-20 – 2024-01-12 (×3): qty 90, 90d supply, fill #0

## 2023-12-20 MED ORDER — DILTIAZEM HCL ER COATED BEADS 240 MG PO CP24
240.0000 mg | ORAL_CAPSULE | Freq: Every day | ORAL | 1 refills | Status: AC
  Filled 2023-12-20 – 2024-01-12 (×3): qty 90, 90d supply, fill #0

## 2023-12-21 ENCOUNTER — Other Ambulatory Visit: Payer: Self-pay

## 2023-12-21 ENCOUNTER — Other Ambulatory Visit (HOSPITAL_COMMUNITY): Payer: Self-pay

## 2024-01-03 ENCOUNTER — Ambulatory Visit (HOSPITAL_COMMUNITY)
Admission: EM | Admit: 2024-01-03 | Discharge: 2024-01-03 | Disposition: A | Discharge status: Home / Self Care | Attending: Physician Assistant | Admitting: Physician Assistant

## 2024-01-03 ENCOUNTER — Encounter (HOSPITAL_COMMUNITY): Payer: Self-pay | Admitting: *Deleted

## 2024-01-03 ENCOUNTER — Other Ambulatory Visit: Payer: Self-pay

## 2024-01-03 DIAGNOSIS — J069 Acute upper respiratory infection, unspecified: Secondary | ICD-10-CM | POA: Diagnosis not present

## 2024-01-03 DIAGNOSIS — R051 Acute cough: Secondary | ICD-10-CM | POA: Diagnosis not present

## 2024-01-03 LAB — POC COVID19/FLU A&B COMBO
Covid Antigen, POC: NEGATIVE
Influenza A Antigen, POC: NEGATIVE
Influenza B Antigen, POC: NEGATIVE

## 2024-01-03 MED ORDER — BENZONATATE 100 MG PO CAPS: 100.0000 mg | ORAL_CAPSULE | Freq: Three times a day (TID) | ORAL | 0 refills | Status: DC

## 2024-01-03 MED ORDER — FLUTICASONE PROPIONATE 50 MCG/ACT NA SUSP: 1.0000 | Freq: Every day | NASAL | 0 refills | Status: AC

## 2024-01-03 NOTE — ED Triage Notes (Signed)
 PT has had a cough since Friday. Pt also reports drainage in back of throat . Pt also has general body aches.

## 2024-01-03 NOTE — ED Provider Notes (Signed)
 [facility]    CSN: [id number] Arrival date & time: 01/03/24  9095      History   Chief Complaint Chief Complaint  Patient presents with   Cough    HPI Shannon Khan is a 63 y.o. female.   Patient presents today companied by her daughter who provided translation after they declined a interpreter.  Reports a 2-day history of URI symptoms including cough particularly at night, scratchy throat, postnasal drainage, body aches.  Denies any fever, chest pain, shortness of breath, diarrhea.  She did have an episode of emesis following a coughing fit earlier today.  She has not been taking any over-the-counter medications for symptom management.  Denies any known sick contacts.  Has never had COVID or flu.  She has had COVID-19 vaccines and is up-to-date on her influenza vaccine as well (given 12/16/2023).  Denies any recent antibiotics or steroids.  She denies any chronic lung conditions including asthma, COPD, smoking.  She does have diabetes but reports her blood sugars are well-controlled.    Past Medical History:  Diagnosis Date   Hypertension     Patient Active Problem List   Diagnosis Date Noted   Multiple thyroid  nodules 03/28/2022   Hyperlipidemia associated with type 2 diabetes mellitus (HCC) 08/24/2019   Type 2 diabetes mellitus with diabetic polyneuropathy, without long-term current use of insulin (HCC) 03/24/2019   Acquired thrombophilia 03/24/2019   Primary osteoarthritis of both knees 03/24/2019   Class 3 severe obesity due to excess calories with serious comorbidity and body mass index (BMI) of 45.0 to 49.9 in adult (HCC) 11/19/2018   Numbness and tingling of both feet 11/19/2018   PAF (paroxysmal atrial fibrillation) (HCC) 11/18/2018   DM (diabetes mellitus), type 2 (HCC) 10/12/2018   Imbalance 10/11/2018   On continuous oral anticoagulation 10/11/2018   Atrial fibrillation with RVR (HCC) 09/17/2018   Essential hypertension 06/14/2018   Obesity (BMI  30-39.9) 06/14/2018   History of thyroidectomy 06/14/2018   Chronic pain of both knees 06/14/2018    Past Surgical History:  Procedure Laterality Date   ORIF TIBIA & FIBULA FRACTURES Left 2012   THYROIDECTOMY  1990   for goiter    OB History   No obstetric history on file.      Home Medications    Prior to Admission medications   Medication Sig Start Date End Date Taking? Authorizing Provider  aspirin  EC 81 MG tablet Take 1 tablet (81 mg total) by mouth daily. Swallow whole. 12/02/19  Yes Lavona Agent, MD  atorvastatin  (LIPITOR) 20 MG tablet Take 1 tablet (20 mg total) by mouth daily. 12/20/23  Yes Vicci Barnie NOVAK, MD  benzonatate  (TESSALON ) 100 MG capsule Take 1 capsule (100 mg total) by mouth every 8 (eight) hours. 01/03/24  Yes Keiasia Christianson K, PA-C  Blood Glucose Monitoring Suppl (ONETOUCH VERIO REFLECT) w/Device KIT 1 kit by Does not apply route 2 (two) times daily. 02/02/19  Yes Vicci Barnie NOVAK, MD  carvedilol  (COREG ) 6.25 MG tablet Take 1 tablet (6.25 mg total) by mouth 2 (two) times daily with a meal. 08/11/23  Yes Vicci Barnie NOVAK, MD  diltiazem  (CARDIZEM  CD) 240 MG 24 hr capsule Take 1 capsule (240 mg total) by mouth daily. 12/20/23  Yes Vicci Barnie NOVAK, MD  fluticasone  (FLONASE ) 50 MCG/ACT nasal spray Place 1 spray into both nostrils daily. 01/03/24  Yes Lilyan Prete, Rocky K, PA-C  glucose blood (ONETOUCH VERIO) test strip Use as instructed 02/02/19  Yes Vicci Barnie NOVAK, MD  losartan  (COZAAR ) 100 MG tablet Take 1 tablet (100 mg total) by mouth daily.Must have office visit for refills 08/11/23  Yes Vicci Barnie NOVAK, MD  metFORMIN  (GLUCOPHAGE ) 500 MG tablet Take 1 tablet (500 mg total) by mouth daily with breakfast. 08/11/23  Yes Vicci Barnie NOVAK, MD  OneTouch Delica Lancets 33G MISC Use to check blood sugar twice daily. 02/02/19  Yes Vicci Barnie NOVAK, MD  spironolactone  (ALDACTONE ) 25 MG tablet Take 1 tablet (25 mg total) by mouth daily. 12/20/23  Yes Vicci Barnie NOVAK, MD    Family History Family History  Problem Relation Age of Onset   Hypertension Mother    Hypertension Father    Stroke Father    Diabetes Paternal Aunt    Breast cancer Neg Hx     Social History Social History   Tobacco Use   Smoking status: Never   Smokeless tobacco: Never  Vaping Use   Vaping status: Never Used  Substance Use Topics   Alcohol use: Not Currently    Comment: occasional wine   Drug use: Never     Allergies   Patient has no known allergies.   Review of Systems Review of Systems  Constitutional:  Positive for activity change. Negative for appetite change, fatigue and fever.  HENT:  Positive for congestion and postnasal drip. Negative for sinus pressure, sneezing and sore throat.   Respiratory:  Positive for cough. Negative for shortness of breath.   Cardiovascular:  Negative for chest pain.  Gastrointestinal:  Positive for vomiting (Posttussive). Negative for abdominal pain, diarrhea and nausea.  Musculoskeletal:  Positive for arthralgias and myalgias.  Neurological:  Positive for headaches. Negative for dizziness and light-headedness.     Physical Exam Triage Vital Signs ED Triage Vitals  Encounter Vitals Group     BP 01/03/24 0948 133/76     Girls Systolic BP Percentile --      Girls Diastolic BP Percentile --      Boys Systolic BP Percentile --      Boys Diastolic BP Percentile --      Pulse Rate 01/03/24 0948 70     Resp 01/03/24 0948 20     Temp 01/03/24 0948 98.2 F (36.8 C)     Temp src --      SpO2 01/03/24 0948 96 %     Weight --      Height --      Head Circumference --      Peak Flow --      Pain Score 01/03/24 0944 4     Pain Loc --      Pain Education --      Exclude from Growth Chart --    No data found.  Updated Vital Signs BP 133/76   Pulse 70   Temp 98.2 F (36.8 C)   Resp 20   SpO2 96%   Visual Acuity Right Eye Distance:   Left Eye Distance:   Bilateral Distance:    Right Eye Near:   Left Eye  Near:    Bilateral Near:     Physical Exam Vitals reviewed.  Constitutional:      General: She is awake. She is not in acute distress.    Appearance: Normal appearance. She is well-developed. She is not ill-appearing.     Comments: Very pleasant female appears stated age in no acute distress sitting comfortable in exam room  HENT:     Head: Normocephalic and atraumatic.     Right Ear:  Tympanic membrane, ear canal and external ear normal. Tympanic membrane is not erythematous or bulging.     Left Ear: Tympanic membrane, ear canal and external ear normal. Tympanic membrane is not erythematous or bulging.     Nose:     Right Sinus: No maxillary sinus tenderness or frontal sinus tenderness.     Left Sinus: No maxillary sinus tenderness or frontal sinus tenderness.     Mouth/Throat:     Pharynx: Uvula midline. Postnasal drip present. No oropharyngeal exudate or posterior oropharyngeal erythema.  Cardiovascular:     Rate and Rhythm: Normal rate and regular rhythm.     Heart sounds: Normal heart sounds, S1 normal and S2 normal. No murmur heard. Pulmonary:     Effort: Pulmonary effort is normal.     Breath sounds: Normal breath sounds. No wheezing, rhonchi or rales.     Comments: Clear to auscultation bilaterally Psychiatric:        Behavior: Behavior is cooperative.      UC Treatments / Results  Labs (all labs ordered are listed, but only abnormal results are displayed) Labs Reviewed  POC COVID19/FLU A&B COMBO    EKG   Radiology No results found.  Procedures Procedures (including critical care time)  Medications Ordered in UC Medications - No data to display  Initial Impression / Assessment and Plan / UC Course  I have reviewed the triage vital signs and the nursing notes.  Pertinent labs & imaging results that were available during my care of the patient were reviewed by me and considered in my medical decision making (see chart for details).     Patient is  well-appearing, afebrile, nontoxic, nontachycardic.  No evidence of acute infection on physical exam that warrant initiation of antibiotics.  COVID and flu testing was negative in clinic.  We discussed she likely has a different virus causing her symptoms.  Chest x-ray was deferred as she had no adventitious lung sounds on exam and oxygen saturation was 96%.  Will treat symptomatically and she was given Tessalon  to be taken up to 3 times a day for cough as well as fluticasone  nasal spray.  Also recommended over-the-counter medication including Mucinex, nasal saline/sinus rinses, humidifier in her room.  We discussed that if she is not feeling better within 3 to 5 days she should return for reevaluation.  If she has any worsening symptoms she needs to be seen immediately.  Strict return precautions given.  Excuse note provided.  All questions answered to patient and daughter satisfaction and they expressed understanding and agreement with treatment plan.  Final Clinical Impressions(s) / UC Diagnoses   Final diagnoses:  Acute cough  Viral URI with cough     Discharge Instructions      You were negative for COVID and flu.  I suspect you have a different virus causing your symptoms.  Take Tessalon  3 times a day to help with your cough.  I also recommend fluticasone  nasal spray as well as over-the-counter Mucinex, nasal saline/sinus rinses, humidifier in your room.  Make sure that you are resting and drinking plenty of fluid.  If you are not feeling better within 5 days please return for reevaluation.  If anything worsens and you have high fever, worsening cough, shortness of breath, chest pain, nausea/vomiting interfere with oral intake, weakness you need to be seen immediately.     ED Prescriptions     Medication Sig Dispense Auth. Provider   benzonatate  (TESSALON ) 100 MG capsule Take 1 capsule (100  mg total) by mouth every 8 (eight) hours. 21 capsule Lorelee Mclaurin K, PA-C   fluticasone  (FLONASE )  50 MCG/ACT nasal spray Place 1 spray into both nostrils daily. 16 g Charmion Hapke K, PA-C      PDMP not reviewed this encounter.   Sherrell Rocky POUR, PA-C 01/03/24 1100

## 2024-01-03 NOTE — Discharge Instructions (Addendum)
 You were negative for COVID and flu.  I suspect you have a different virus causing your symptoms.  Take Tessalon  3 times a day to help with your cough.  I also recommend fluticasone  nasal spray as well as over-the-counter Mucinex, nasal saline/sinus rinses, humidifier in your room.  Make sure that you are resting and drinking plenty of fluid.  If you are not feeling better within 5 days please return for reevaluation.  If anything worsens and you have high fever, worsening cough, shortness of breath, chest pain, nausea/vomiting interfere with oral intake, weakness you need to be seen immediately.

## 2024-01-04 ENCOUNTER — Ambulatory Visit
Admission: RE | Admit: 2024-01-04 | Discharge: 2024-01-04 | Disposition: A | Source: Ambulatory Visit | Attending: Internal Medicine | Admitting: Internal Medicine

## 2024-01-04 DIAGNOSIS — E042 Nontoxic multinodular goiter: Secondary | ICD-10-CM

## 2024-01-05 ENCOUNTER — Ambulatory Visit: Payer: Self-pay | Admitting: Internal Medicine

## 2024-01-07 ENCOUNTER — Other Ambulatory Visit: Payer: Self-pay

## 2024-01-07 ENCOUNTER — Other Ambulatory Visit (HOSPITAL_COMMUNITY): Payer: Self-pay

## 2024-01-13 ENCOUNTER — Other Ambulatory Visit: Payer: Self-pay

## 2024-01-13 ENCOUNTER — Other Ambulatory Visit (HOSPITAL_COMMUNITY): Payer: Self-pay

## 2024-01-13 ENCOUNTER — Other Ambulatory Visit: Payer: Self-pay | Admitting: Internal Medicine

## 2024-01-13 DIAGNOSIS — I152 Hypertension secondary to endocrine disorders: Secondary | ICD-10-CM

## 2024-01-13 DIAGNOSIS — E1169 Type 2 diabetes mellitus with other specified complication: Secondary | ICD-10-CM

## 2024-01-13 NOTE — Telephone Encounter (Unsigned)
 Copied from CRM #[id number]. Topic: Clinical - Medication Refill >> Jan 13, 2024  1:39 PM Kevelyn M wrote: Medication: atorvastatin  (LIPITOR) 20 MG tablet, diltiazem  (CARDIZEM  CD) 240 MG 24 hr capsule, spironolactone  (ALDACTONE ) 25 MG tablet, carvedilol  (COREG ) 6.25 MG tablet, losartan  (COZAAR ) 100 MG tablet, metFORMIN  (GLUCOPHAGE ) 500 MG tablet  Has the patient contacted their pharmacy? Yes (Agent: If no, request that the patient contact the pharmacy for the refill. If patient does not wish to contact the pharmacy document the reason why and proceed with request.) (Agent: If yes, when and what did the pharmacy advise?)  This is the patient's preferred pharmacy:  [facility] [address], [place], [place] [address]  Is this the correct pharmacy for this prescription? Yes If no, delete pharmacy and type the correct one.   Has the prescription been filled recently? No  Is the patient out of the medication? No, about to run out  Has the patient been seen for an appointment in the last year OR does the patient have an upcoming appointment? Yes  Can we respond through MyChart? Yes  Agent: Please be advised that Rx refills may take up to 3 business days. We ask that you follow-up with your pharmacy.

## 2024-01-15 MED ORDER — CARVEDILOL 6.25 MG PO TABS
6.2500 mg | ORAL_TABLET | Freq: Two times a day (BID) | ORAL | 0 refills | Status: DC
  Filled 2024-01-15 – 2024-01-18 (×3): qty 180, 90d supply, fill #0

## 2024-01-15 MED ORDER — SPIRONOLACTONE 25 MG PO TABS
25.0000 mg | ORAL_TABLET | Freq: Every day | ORAL | 0 refills | Status: DC
  Filled 2024-01-15 – 2024-01-18 (×3): qty 90, 90d supply, fill #0

## 2024-01-15 MED ORDER — METFORMIN HCL 500 MG PO TABS
500.0000 mg | ORAL_TABLET | Freq: Every day | ORAL | 0 refills | Status: AC
  Filled 2024-01-15 – 2024-01-18 (×3): qty 90, 90d supply, fill #0

## 2024-01-15 MED ORDER — DILTIAZEM HCL ER COATED BEADS 240 MG PO CP24
240.0000 mg | ORAL_CAPSULE | Freq: Every day | ORAL | 0 refills | Status: DC
  Filled 2024-01-15 – 2024-01-18 (×3): qty 90, 90d supply, fill #0

## 2024-01-15 MED ORDER — ATORVASTATIN CALCIUM 20 MG PO TABS
20.0000 mg | ORAL_TABLET | Freq: Every day | ORAL | 1 refills | Status: AC
  Filled 2024-01-15 – 2024-01-18 (×3): qty 90, 90d supply, fill #0

## 2024-01-15 NOTE — Telephone Encounter (Signed)
 Requested Prescriptions  Pending Prescriptions Disp Refills   atorvastatin  (LIPITOR) 20 MG tablet 90 tablet 1    Sig: Take 1 tablet (20 mg total) by mouth daily.     Cardiovascular:  Antilipid - Statins Failed - 01/15/2024 11:10 PM      Failed - Lipid Panel in normal range within the last 12 months    Cholesterol, Total  Date Value Ref Range Status  08/11/2023 157 100 - 199 mg/dL Final   LDL Chol Calc (NIH)  Date Value Ref Range Status  08/11/2023 80 0 - 99 mg/dL Final   HDL  Date Value Ref Range Status  08/11/2023 64 >39 mg/dL Final   Triglycerides  Date Value Ref Range Status  08/11/2023 65 0 - 149 mg/dL Final         Passed - Patient is not pregnant      Passed - Valid encounter within last 12 months    Recent Outpatient Visits           1 month ago Type 2 diabetes mellitus with morbid obesity (HCC)   [facility] Dept Of [place]. [facility] Vicci Sober B, MD   5 months ago Type 2 diabetes mellitus with morbid obesity [facility])   [place] [facility] - [facility] Dept Of [place]. [facility] Vicci Sober NOVAK, MD   1 year ago Type 2 diabetes mellitus with obesity   [facility] [facility] Dept [facility] [place]. [facility] Vicci Sober NOVAK, MD   1 year ago Type 2 diabetes mellitus with obesity   [place] [facility] - [facility] Dept Of [place]. [facility] Vicci Sober NOVAK, MD   1 year ago Type 2 diabetes mellitus with obesity   [place] [facility] - A Dept Of . [facility] Vicci Sober B, MD               diltiazem  (CARDIZEM  CD) 240 MG 24 hr capsule 90 capsule 0    Sig: Take 1 capsule (240 mg total) by mouth daily.     Cardiovascular: Calcium  Channel Blockers 3 Passed - 01/15/2024 11:10 PM      Passed - ALT in normal range and within 360 days    ALT  Date Value Ref Range Status  08/11/2023 24 0 - 32 IU/L Final         Passed - AST in  normal range and within 360 days    AST  Date Value Ref Range Status  08/11/2023 27 0 - 40 IU/L Final         Passed - Cr in normal range and within 360 days    Creatinine, Ser  Date Value Ref Range Status  08/11/2023 0.94 0.57 - 1.00 mg/dL Final         Passed - Last BP in normal range    BP Readings from Last 1 Encounters:  01/03/24 133/76         Passed - Last Heart Rate in normal range    Pulse Readings from Last 1 Encounters:  01/03/24 70         Passed - Valid encounter within last 6 months    Recent Outpatient Visits           1 month ago Type 2 diabetes mellitus with morbid obesity (HCC)   [place] [facility] Dept Of  [place]. [facility] Vicci Sober B, MD   5 months ago Type 2 diabetes mellitus with morbid obesity [facility])   [place] [facility] - [facility] Dept Of [place]. [facility] Vicci Sober NOVAK, MD   1 year ago Type 2 diabetes mellitus with obesity   [place] [facility] - A Dept Of [place]. [facility] Vicci Sober NOVAK, MD   1 year ago Type 2 diabetes mellitus with obesity   [place] [facility] - A Dept Of [place]. [facility] Vicci Sober NOVAK, MD   1 year ago Type 2 diabetes mellitus with obesity   [place] [facility] - A Dept Of [place]. [facility] Vicci Sober NOVAK, MD               spironolactone  (ALDACTONE ) 25 MG tablet 90 tablet 0    Sig: Take 1 tablet (25 mg total) by mouth daily.     Cardiovascular: Diuretics - Aldosterone Antagonist Passed - 01/15/2024 11:10 PM      Passed - Cr in normal range and within 180 days    Creatinine, Ser  Date Value Ref Range Status  08/11/2023 0.94 0.57 - 1.00 mg/dL Final         Passed - K in normal range and within 180 days    Potassium  Date Value Ref Range Status  08/11/2023 4.9 3.5 - 5.2 mmol/L Final         Passed - Na in normal range and within 180 days    Sodium  Date Value  Ref Range Status  08/11/2023 140 134 - 144 mmol/L Final         Passed - eGFR is 30 or above and within 180 days    GFR calc Af Amer  Date Value Ref Range Status  01/14/2019 91 >59 mL/min/1.73 Final   GFR calc non Af Amer  Date Value Ref Range Status  01/14/2019 79 >59 mL/min/1.73 Final   eGFR  Date Value Ref Range Status  08/11/2023 67 >59 mL/min/1.73 Final         Passed - Last BP in normal range    BP Readings from Last 1 Encounters:  01/03/24 133/76         Passed - Valid encounter within last 6 months    Recent Outpatient Visits           1 month ago Type 2 diabetes mellitus with morbid obesity (HCC)   [place] [facility] Dept Of [place]. [facility] Vicci Sober B, MD   5 months ago Type 2 diabetes mellitus with morbid obesity [facility])   [place] [facility] - [facility] Dept Of [place]. [facility] Vicci Sober NOVAK, MD   1 year ago Type 2 diabetes mellitus with obesity   [place] [facility] - A Dept Of [place]. [facility] Vicci Sober NOVAK, MD   1 year ago Type 2 diabetes mellitus with obesity   [place] [facility] - A Dept Of [place]. [facility] Vicci Sober NOVAK, MD   1 year ago Type 2 diabetes mellitus with obesity    [facility] - A Dept Of [place]. [facility] Vicci Sober B, MD               metFORMIN  (GLUCOPHAGE ) 500 MG tablet 90 tablet  0    Sig: Take 1 tablet (500 mg total) by mouth daily with breakfast.     Endocrinology:  Diabetes - Biguanides Failed - 01/15/2024 11:10 PM      Failed - B12 Level in normal range and within 720 days    Vitamin B-12  Date Value Ref Range Status  11/19/2018 913 232 - 1,245 pg/mL Final         Failed - CBC within normal limits and completed in the last 12 months    WBC  Date Value Ref Range Status  08/11/2023 7.7 3.4 - 10.8 x10E3/uL Final  09/17/2018 9.8 4.0 - 10.5 K/uL Final   RBC   Date Value Ref Range Status  08/11/2023 5.00 3.77 - 5.28 x10E6/uL Final  09/17/2018 4.74 3.87 - 5.11 MIL/uL Final   Hemoglobin  Date Value Ref Range Status  08/11/2023 14.8 11.1 - 15.9 g/dL Final   Hematocrit  Date Value Ref Range Status  08/11/2023 46.1 34.0 - 46.6 % Final   MCHC  Date Value Ref Range Status  08/11/2023 32.1 31.5 - 35.7 g/dL Final  91/78/7979 66.1 30.0 - 36.0 g/dL Final   [facility]  Date Value Ref Range Status  08/11/2023 29.6 26.6 - 33.0 pg Final  09/17/2018 28.9 26.0 - 34.0 pg Final   MCV  Date Value Ref Range Status  08/11/2023 92 79 - 97 fL Final   No results found for: PLTCOUNTKUC, LABPLAT, POCPLA RDW  Date Value Ref Range Status  08/11/2023 12.4 11.7 - 15.4 % Final         Passed - Cr in normal range and within 360 days    Creatinine, Ser  Date Value Ref Range Status  08/11/2023 0.94 0.57 - 1.00 mg/dL Final         Passed - HBA1C is between 0 and 7.9 and within 180 days    HbA1c, POC (controlled diabetic range)  Date Value Ref Range Status  12/14/2023 6.4 0.0 - 7.0 % Final         Passed - eGFR in normal range and within 360 days    GFR calc Af Amer  Date Value Ref Range Status  01/14/2019 91 >59 mL/min/1.73 Final   GFR calc non Af Amer  Date Value Ref Range Status  01/14/2019 79 >59 mL/min/1.73 Final   eGFR  Date Value Ref Range Status  08/11/2023 67 >59 mL/min/1.73 Final         Passed - Valid encounter within last 6 months    Recent Outpatient Visits           1 month ago Type 2 diabetes mellitus with morbid obesity (HCC)   [place] [facility] Dept Of [place]. [facility] Vicci Sober B, MD   5 months ago Type 2 diabetes mellitus with morbid obesity [facility])   [place] [facility] Dept Of [place]. [facility] Vicci Sober NOVAK, MD   1 year ago Type 2 diabetes mellitus with obesity   [place] [facility] - A Dept Of [place]. [facility] Vicci Sober NOVAK, MD   1 year ago Type 2 diabetes mellitus with obesity   [place] [facility] - A Dept Of [place]. [facility] Vicci Sober NOVAK, MD   1 year ago Type 2 diabetes mellitus with obesity   [place] [facility] - A Dept Of [place]. [facility] Bunn,  Barnie NOVAK, MD               carvedilol  (COREG ) 6.25 MG tablet 180 tablet 0    Sig: Take 1 tablet (6.25 mg total) by mouth 2 (two) times daily with a meal.     Cardiovascular: Beta Blockers 3 Passed - 01/15/2024 11:10 PM      Passed - Cr in normal range and within 360 days    Creatinine, Ser  Date Value Ref Range Status  08/11/2023 0.94 0.57 - 1.00 mg/dL Final         Passed - AST in normal range and within 360 days    AST  Date Value Ref Range Status  08/11/2023 27 0 - 40 IU/L Final         Passed - ALT in normal range and within 360 days    ALT  Date Value Ref Range Status  08/11/2023 24 0 - 32 IU/L Final         Passed - Last BP in normal range    BP Readings from Last 1 Encounters:  01/03/24 133/76         Passed - Last Heart Rate in normal range    Pulse Readings from Last 1 Encounters:  01/03/24 70         Passed - Valid encounter within last 6 months    Recent Outpatient Visits           1 month ago Type 2 diabetes mellitus with morbid obesity (HCC)   [place] [facility] Dept Of [place]. [facility] Vicci Barnie B, MD   5 months ago Type 2 diabetes mellitus with morbid obesity [facility])   [place] [facility] - A Dept Of [place]. [facility] Vicci Barnie NOVAK, MD   1 year ago Type 2 diabetes mellitus with obesity   [place] [facility] - A Dept Of [place]. [facility] Vicci Barnie NOVAK, MD   1 year ago Type 2 diabetes mellitus with obesity   [place] [facility] - A Dept Of [place]. [facility] Vicci Barnie NOVAK, MD   1 year ago Type 2 diabetes mellitus with  obesity    [facility] - A Dept Of [place]. [facility] Vicci Barnie NOVAK, MD

## 2024-01-18 ENCOUNTER — Other Ambulatory Visit (HOSPITAL_COMMUNITY): Payer: Self-pay

## 2024-01-18 ENCOUNTER — Other Ambulatory Visit: Payer: Self-pay

## 2024-01-19 ENCOUNTER — Other Ambulatory Visit: Payer: Self-pay

## 2024-02-01 ENCOUNTER — Encounter (HOSPITAL_COMMUNITY): Payer: Self-pay

## 2024-02-01 ENCOUNTER — Ambulatory Visit (HOSPITAL_COMMUNITY)
Admission: EM | Admit: 2024-02-01 | Discharge: 2024-02-01 | Disposition: A | Discharge status: Home / Self Care | Attending: Family Medicine | Admitting: Family Medicine

## 2024-02-01 ENCOUNTER — Ambulatory Visit
Admission: RE | Admit: 2024-02-01 | Discharge: 2024-02-01 | Disposition: A | Discharge status: Home / Self Care | Source: Ambulatory Visit | Attending: Internal Medicine | Admitting: Internal Medicine

## 2024-02-01 DIAGNOSIS — Z1231 Encounter for screening mammogram for malignant neoplasm of breast: Secondary | ICD-10-CM

## 2024-02-01 DIAGNOSIS — J069 Acute upper respiratory infection, unspecified: Secondary | ICD-10-CM

## 2024-02-01 LAB — POCT INFLUENZA A/B
Influenza A, POC: NEGATIVE
Influenza B, POC: NEGATIVE

## 2024-02-01 LAB — POC SOFIA SARS ANTIGEN FIA: SARS Coronavirus 2 Ag: NEGATIVE

## 2024-02-01 MED ORDER — BENZONATATE 100 MG PO CAPS: 100.0000 mg | ORAL_CAPSULE | Freq: Three times a day (TID) | ORAL | 0 refills | Status: DC | PRN

## 2024-02-01 NOTE — Discharge Instructions (Signed)
 The testing for flu and COVID is negative.  Take benzonatate  100 mg, 1 tab every 8 hours as needed for cough.

## 2024-02-01 NOTE — ED Provider Notes (Signed)
 " [facility]    CSN: [id number] Arrival date & time: 02/01/24  1037      History   Chief Complaint Chief Complaint  Patient presents with   Cough    HPI Shannon Khan is a 64 y.o. female.    Cough  Here for cough and nasal congestion.  Symptoms began on January 2.  No fever or chills and no nausea vomiting and diarrhea.  She has been bringing up large amounts of mucus after drinking water first thing every morning.  She also notes that she has had increased urinary incontinence when coughing with this illness.  She notes that she was here a month ago for a similar illness.  The medicine for cough did help her symptoms when she would take it.  Apparently she did have symptoms totally resolved and then they started again on January 2.  She is up-to-date on flu and COVID vaccines  She has diabetes and it is well-controlled  No ear pain; no shortness of breath or wheeze  Past Medical History:  Diagnosis Date   Hypertension     Patient Active Problem List   Diagnosis Date Noted   Multiple thyroid  nodules 03/28/2022   Hyperlipidemia associated with type 2 diabetes mellitus (HCC) 08/24/2019   Type 2 diabetes mellitus with diabetic polyneuropathy, without long-term current use of insulin (HCC) 03/24/2019   Acquired thrombophilia 03/24/2019   Primary osteoarthritis of both knees 03/24/2019   Class 3 severe obesity due to excess calories with serious comorbidity and body mass index (BMI) of 45.0 to 49.9 in adult (HCC) 11/19/2018   Numbness and tingling of both feet 11/19/2018   PAF (paroxysmal atrial fibrillation) (HCC) 11/18/2018   DM (diabetes mellitus), type 2 (HCC) 10/12/2018   Imbalance 10/11/2018   On continuous oral anticoagulation 10/11/2018   Atrial fibrillation with RVR (HCC) 09/17/2018   Essential hypertension 06/14/2018   Obesity (BMI 30-39.9) 06/14/2018   History of thyroidectomy 06/14/2018   Chronic pain of both knees 06/14/2018    Past  Surgical History:  Procedure Laterality Date   ORIF TIBIA & FIBULA FRACTURES Left 2012   THYROIDECTOMY  1990   for goiter    OB History   No obstetric history on file.      Home Medications    Prior to Admission medications  Medication Sig Start Date End Date Taking? Authorizing Provider  benzonatate  (TESSALON ) 100 MG capsule Take 1 capsule (100 mg total) by mouth 3 (three) times daily as needed for cough. 02/01/24  Yes Vonna Sharlet POUR, MD  aspirin  EC 81 MG tablet Take 1 tablet (81 mg total) by mouth daily. Swallow whole. 12/02/19   Lavona Agent, MD  atorvastatin  (LIPITOR) 20 MG tablet Take 1 tablet (20 mg total) by mouth daily. 01/15/24   Vicci Barnie NOVAK, MD  Blood Glucose Monitoring Suppl (ONETOUCH VERIO REFLECT) w/Device KIT 1 kit by Does not apply route 2 (two) times daily. 02/02/19   Vicci Barnie NOVAK, MD  carvedilol  (COREG ) 6.25 MG tablet Take 1 tablet (6.25 mg total) by mouth 2 (two) times daily with a meal. 01/15/24   Vicci Barnie NOVAK, MD  diltiazem  (CARDIZEM  CD) 240 MG 24 hr capsule Take 1 capsule (240 mg total) by mouth daily. 01/15/24   Vicci Barnie NOVAK, MD  fluticasone  (FLONASE ) 50 MCG/ACT nasal spray Place 1 spray into both nostrils daily. 01/03/24   Raspet, Rocky POUR, PA-C  glucose blood (ONETOUCH VERIO) test strip Use as instructed 02/02/19   Vicci Barnie  B, MD  losartan  (COZAAR ) 100 MG tablet Take 1 tablet (100 mg total) by mouth daily.Must have office visit for refills 08/11/23   Vicci Barnie NOVAK, MD  metFORMIN  (GLUCOPHAGE ) 500 MG tablet Take 1 tablet (500 mg total) by mouth daily with breakfast. 01/15/24   Vicci Barnie NOVAK, MD  OneTouch Delica Lancets 33G MISC Use to check blood sugar twice daily. 02/02/19   Vicci Barnie NOVAK, MD  spironolactone  (ALDACTONE ) 25 MG tablet Take 1 tablet (25 mg total) by mouth daily. 01/15/24   Vicci Barnie NOVAK, MD    Family History Family History  Problem Relation Age of Onset   Hypertension Mother    Hypertension Father     Stroke Father    Diabetes Paternal Aunt    Breast cancer Neg Hx     Social History Social History[1]   Allergies   Patient has no known allergies.   Review of Systems Review of Systems  Respiratory:  Positive for cough.      Physical Exam Triage Vital Signs ED Triage Vitals [02/01/24 1217]  Encounter Vitals Group     BP 131/82     Girls Systolic BP Percentile      Girls Diastolic BP Percentile      Boys Systolic BP Percentile      Boys Diastolic BP Percentile      Pulse Rate 68     Resp 16     Temp 98.7 F (37.1 C)     Temp Source Oral     SpO2 97 %     Weight      Height      Head Circumference      Peak Flow      Pain Score 0     Pain Loc      Pain Education      Exclude from Growth Chart    No data found.  Updated Vital Signs BP 131/82 (BP Location: Right Arm)   Pulse 68   Temp 98.7 F (37.1 C) (Oral)   Resp 16   SpO2 97%   Visual Acuity Right Eye Distance:   Left Eye Distance:   Bilateral Distance:    Right Eye Near:   Left Eye Near:    Bilateral Near:     Physical Exam Vitals reviewed.  Constitutional:      General: She is not in acute distress.    Appearance: She is not ill-appearing, toxic-appearing or diaphoretic.  HENT:     Nose: Nose normal.     Mouth/Throat:     Mouth: Mucous membranes are moist.     Comments: No erythema.  There is clear mucus draining in the posterior oropharynx Eyes:     Extraocular Movements: Extraocular movements intact.     Conjunctiva/sclera: Conjunctivae normal.     Pupils: Pupils are equal, round, and reactive to light.  Cardiovascular:     Rate and Rhythm: Normal rate and regular rhythm.     Heart sounds: No murmur heard. Pulmonary:     Effort: Pulmonary effort is normal. No respiratory distress.     Breath sounds: No stridor. No wheezing, rhonchi or rales.  Musculoskeletal:     Cervical back: Neck supple.  Lymphadenopathy:     Cervical: No cervical adenopathy.  Skin:    Capillary Refill:  Capillary refill takes less than 2 seconds.     Coloration: Skin is not jaundiced or pale.  Neurological:     General: No focal deficit present.  Mental Status: She is alert and oriented to person, place, and time.  Psychiatric:        Behavior: Behavior normal.      UC Treatments / Results  Labs (all labs ordered are listed, but only abnormal results are displayed) Labs Reviewed  POCT INFLUENZA A/B - Normal  POC SOFIA SARS ANTIGEN FIA    EKG   Radiology No results found.  Procedures Procedures (including critical care time)  Medications Ordered in UC Medications - No data to display  Initial Impression / Assessment and Plan / UC Course  I have reviewed the triage vital signs and the nursing notes.  Pertinent labs & imaging results that were available during my care of the patient were reviewed by me and considered in my medical decision making (see chart for details).     Testing for flu and COVID are again negative.  Tessalon  Perles were sent in for her cough as they worked well for her previously.   Final Clinical Impressions(s) / UC Diagnoses   Final diagnoses:  Viral URI     Discharge Instructions      The testing for flu and COVID is negative.  Take benzonatate  100 mg, 1 tab every 8 hours as needed for cough.       ED Prescriptions     Medication Sig Dispense Auth. Provider   benzonatate  (TESSALON ) 100 MG capsule Take 1 capsule (100 mg total) by mouth 3 (three) times daily as needed for cough. 21 capsule Tityana Pagan K, MD      PDMP not reviewed this encounter.     [1]  Social History Tobacco Use   Smoking status: Never   Smokeless tobacco: Never  Vaping Use   Vaping status: Never Used  Substance Use Topics   Alcohol use: Not Currently    Comment: occasional wine   Drug use: Never     Vonna Sharlet POUR, MD 02/01/24 1310  "

## 2024-02-01 NOTE — ED Triage Notes (Signed)
 Patient here today with c/o cough X 3 days. Patient states that the cough is worse at night and keeps her up at night. Patient has taken some cough syrups with no relief. Patient states that she had a cough a month ago.

## 2024-02-04 ENCOUNTER — Encounter: Payer: Self-pay | Admitting: Internal Medicine

## 2024-02-08 ENCOUNTER — Ambulatory Visit
Admission: RE | Admit: 2024-02-08 | Discharge: 2024-02-08 | Disposition: A | Source: Ambulatory Visit | Attending: Internal Medicine | Admitting: Internal Medicine

## 2024-02-08 ENCOUNTER — Other Ambulatory Visit: Payer: Self-pay

## 2024-02-08 ENCOUNTER — Ambulatory Visit: Attending: Internal Medicine | Admitting: Internal Medicine

## 2024-02-08 ENCOUNTER — Encounter: Payer: Self-pay | Admitting: Internal Medicine

## 2024-02-08 VITALS — BP 125/80 | HR 78 | Temp 98.3°F | Ht 62.0 in

## 2024-02-08 DIAGNOSIS — E1159 Type 2 diabetes mellitus with other circulatory complications: Secondary | ICD-10-CM | POA: Diagnosis not present

## 2024-02-08 DIAGNOSIS — R053 Chronic cough: Secondary | ICD-10-CM

## 2024-02-08 DIAGNOSIS — I10 Essential (primary) hypertension: Secondary | ICD-10-CM | POA: Diagnosis not present

## 2024-02-08 MED ORDER — DILTIAZEM HCL ER COATED BEADS 240 MG PO CP24
240.0000 mg | ORAL_CAPSULE | Freq: Every day | ORAL | 0 refills | Status: AC
  Filled 2024-02-08: qty 90, 90d supply, fill #0

## 2024-02-08 MED ORDER — CARVEDILOL 6.25 MG PO TABS
6.2500 mg | ORAL_TABLET | Freq: Two times a day (BID) | ORAL | 0 refills | Status: AC
  Filled 2024-02-08: qty 180, 90d supply, fill #0

## 2024-02-08 MED ORDER — LOSARTAN POTASSIUM 100 MG PO TABS
100.0000 mg | ORAL_TABLET | Freq: Every day | ORAL | 2 refills | Status: AC
  Filled 2024-02-08: qty 90, 90d supply, fill #0

## 2024-02-08 MED ORDER — SPIRONOLACTONE 25 MG PO TABS
25.0000 mg | ORAL_TABLET | Freq: Every day | ORAL | 1 refills | Status: AC
  Filled 2024-02-08: qty 90, 90d supply, fill #0

## 2024-02-08 MED ORDER — OMEPRAZOLE 20 MG PO CPDR
20.0000 mg | DELAYED_RELEASE_CAPSULE | Freq: Every day | ORAL | 0 refills | Status: AC
  Filled 2024-02-08 (×2): qty 30, 30d supply, fill #0

## 2024-02-08 MED ORDER — LORATADINE 10 MG PO TABS
10.0000 mg | ORAL_TABLET | Freq: Every day | ORAL | 0 refills | Status: AC
  Filled 2024-02-08 (×2): qty 30, 30d supply, fill #0

## 2024-02-08 NOTE — Patient Instructions (Signed)
" °  VISIT SUMMARY: During your visit, we discussed your persistent cough, which has been ongoing since December and is particularly severe at night. We also reviewed your blood pressure, which has been elevated, likely due to sleep disturbances caused by your cough. Additionally, we addressed your urinary incontinence during severe coughing episodes and occasional heartburn.  YOUR PLAN: -CHRONIC COUGH AND POSTNASAL DRIP: Your persistent cough, which is worse at night and accompanied by white phlegm, may be due to post-viral infection cough that can persist for several weeks after a viral respiratory illness, post-nasal drip, or gastroesophageal reflux disease (GERD). We recommend continuing with Flonase  nasal spray, adding Claritin  at bedtime, using diabetic Tussin as needed, and taking omeprazole  for 30 days. A chest x-ray has been ordered to rule out pneumonia.  -STRESS URINARY INCONTINENCE: You have been experiencing urinary incontinence during severe coughing episodes, which is consistent with stress incontinence. We will monitor this condition.  -GASTROESOPHAGEAL REFLUX DISEASE: You have occasional heartburn, which may be contributing to your chronic cough. We have prescribed omeprazole  for 30 days to help manage this condition.  INSTRUCTIONS: Please continue using Flonase  nasal spray and start taking Claritin  at bedtime. Use diabetic Tussin as needed for your cough. Take omeprazole  for 30 days as prescribed. A chest x-ray has been ordered to rule out pneumonia. Follow up with us  if your symptoms do not improve or if you have any concerns.                      Contains text generated by Abridge.                                 Contains text generated by Abridge.   "

## 2024-02-08 NOTE — Progress Notes (Signed)
 "   Patient ID: Shannon Khan, female    DOB: 03-Feb-1960  MRN: [id number]  CC: Diabetes (DM f/u./On-going couh X1 mo - See UC notes. Unable to sleep due to cough, intermittent vomiting / urination due to cough. Worse at night /Determine if Losartan  is still needed no refills in some time/Already received flu vax)   Subjective: Shannon Khan is a 64 y.o. female who UC visit. Daughter [name] is with her. Pt has requested to allow daughter to interpret instead of us  using [employer] telephone interpreter. Waiver was signed. Her chronic medical issues include:  patient with history of HTN, obesity, excision of goiter, OA knees, A.fib ( Eliquis  d/c by cardiology 11/2019), DM with  and neuropathy, latent TB treated through HD 2023, thyroid  nodules (needs repeat US  12/2022).   `  Discussed the use of AI scribe software for clinical note transcription with the patient, who gave verbal consent to proceed.  History of Present Illness Shannon Khan is a 64 year old female who presents with a persistent cough.  The cough began in December and was initially evaluated at urgent care on December 7th and again on January 5th.  On her initial visit, she was screened for COVID and flu and tested negative.  She was diagnosed with viral respiratory infection.  She was prescribed Tessalon  Perles.  Symptoms resolved but then she developed cough again around January 2 for which she went to the urgent care on January 5 and cough has persisted since then.   Cough is particularly severe at night, causing difficulty sleeping, while being less severe during the day.   She brings up white phlegm and sometimes coughs to the point of vomiting phlegm. The cough is severe enough to cause urinary incontinence. She experiences a sensation of nasal blockage, particularly when coughing, but finds some relief with Flonase  nasal spray. No nasal or sinus congestion, fever, or recent travel outside the [place] . She experiences occasional  shortness of breath, particularly at night when coughing continuously, which is described as similar to asthma symptoms. She denies using any over-the-counter medications besides the prescribed treatments.  Her blood pressure has been elevated, possibly due to lack of sleep, with recent readings of 130/84. She is currently taking spironolactone , losartan , diltiazem , and carvedilol  for blood pressure management.    Patient Active Problem List   Diagnosis Date Noted   Multiple thyroid  nodules 03/28/2022   Hyperlipidemia associated with type 2 diabetes mellitus (HCC) 08/24/2019   Type 2 diabetes mellitus with diabetic polyneuropathy, without long-term current use of insulin (HCC) 03/24/2019   Acquired thrombophilia 03/24/2019   Primary osteoarthritis of both knees 03/24/2019   Class 3 severe obesity due to excess calories with serious comorbidity and body mass index (BMI) of 45.0 to 49.9 in adult (HCC) 11/19/2018   Numbness and tingling of both feet 11/19/2018   PAF (paroxysmal atrial fibrillation) (HCC) 11/18/2018   DM (diabetes mellitus), type 2 (HCC) 10/12/2018   Imbalance 10/11/2018   On continuous oral anticoagulation 10/11/2018   Atrial fibrillation with RVR (HCC) 09/17/2018   Essential hypertension 06/14/2018   Obesity (BMI 30-39.9) 06/14/2018   History of thyroidectomy 06/14/2018   Chronic pain of both knees 06/14/2018     Medications Ordered Prior to Encounter[1]  Allergies[2]  Social History   Socioeconomic History   Marital status: Single    Spouse name: Not on file   Number of children: 3   Years of education: completed high school   Highest education level: 12th grade  Occupational History   Not on file  Tobacco Use   Smoking status: Never   Smokeless tobacco: Never  Vaping Use   Vaping status: Never Used  Substance and Sexual Activity   Alcohol use: Not Currently    Comment: occasional wine   Drug use: Never   Sexual activity: Yes  Other Topics Concern    Not on file  Social History Narrative   Not on file   Social Drivers of Health   Tobacco Use: Low Risk (02/08/2024)   Patient History    Smoking Tobacco Use: Never    Smokeless Tobacco Use: Never    Passive Exposure: Not on file  Financial Resource Strain: High Risk (12/14/2023)   Overall Financial Resource Strain (CARDIA)    Difficulty of Paying Living Expenses: Hard  Food Insecurity: No Food Insecurity (12/14/2023)   Epic    Worried About Programme Researcher, Broadcasting/film/video in the Last Year: Never true    Ran Out of Food in the Last Year: Never true  Transportation Needs: No Transportation Needs (12/14/2023)   Epic    Lack of Transportation (Medical): No    Lack of Transportation (Non-Medical): No  Physical Activity: Inactive (12/14/2023)   Exercise Vital Sign    Days of Exercise per Week: 0 days    Minutes of Exercise per Session: 0 min  Stress: No Stress Concern Present (12/14/2023)   [employer] of Occupational Health - Occupational Stress Questionnaire    Feeling of Stress: Not at all  Social Connections: Moderately Integrated (12/14/2023)   Social Connection and Isolation Panel    Frequency of Communication with Friends and Family: More than three times a week    Frequency of Social Gatherings with Friends and Family: More than three times a week    Attends Religious Services: More than 4 times per year    Active Member of Clubs or Organizations: No    Attends Banker Meetings: Never    Marital Status: Married  Catering Manager Violence: Not At Risk (12/14/2023)   Epic    Fear of Current or Ex-Partner: No    Emotionally Abused: No    Physically Abused: No    Sexually Abused: No  Depression (PHQ2-9): Low Risk (12/14/2023)   Depression (PHQ2-9)    PHQ-2 Score: 0  Alcohol Screen: Low Risk (12/14/2023)   Alcohol Screen    Last Alcohol Screening Score (AUDIT): 0  Housing: Low Risk (12/14/2023)   Epic    Unable to Pay for Housing in the Last Year: No    Number  of Times Moved in the Last Year: 0    Homeless in the Last Year: No  Utilities: Not At Risk (12/14/2023)   Epic    Threatened with loss of utilities: No  Health Literacy: Adequate Health Literacy (12/14/2023)   B1300 Health Literacy    Frequency of need for help with medical instructions: Rarely    Family History  Problem Relation Age of Onset   Hypertension Mother    Hypertension Father    Stroke Father    Diabetes Paternal Aunt    Breast cancer Neg Hx     Past Surgical History:  Procedure Laterality Date   ORIF TIBIA & FIBULA FRACTURES Left 2012   THYROIDECTOMY  1990   for goiter    ROS: Review of Systems Negative except as stated above  PHYSICAL EXAM: BP 125/80   Pulse 78   Temp 98.3 F (36.8 C) (Oral)   Ht 5' 2 (  1.575 m)   SpO2 98%   BMI 39.51 kg/m   Physical Exam  General appearance - alert, well appearing, and in no distress Mental status - normal mood, behavior, speech, dress, motor activity, and thought processes Nose - mild enlarment nasal turbinates Mouth - mucous membranes moist, pharynx normal without lesions Neck - supple, no significant adenopathy Chest - clear to auscultation, no wheezes, rales or rhonchi, symmetric air entry Heart - normal rate, regular rhythm, normal S1, S2, no murmurs, rubs, clicks or gallops      Latest Ref Rng & Units 08/11/2023    2:53 PM 09/30/2022   11:39 AM 12/27/2021   10:21 AM  CMP  Glucose 70 - 99 mg/dL 94  84    BUN 8 - 27 mg/dL 19  20    Creatinine 9.42 - 1.00 mg/dL 9.05  9.17    Sodium 865 - 144 mmol/L 140  141    Potassium 3.5 - 5.2 mmol/L 4.9  5.0    Chloride 96 - 106 mmol/L 102  105    CO2 20 - 29 mmol/L 19  25    Calcium  8.7 - 10.3 mg/dL 89.4  89.8  89.4   Total Protein 6.0 - 8.5 g/dL 7.8  7.3    Total Bilirubin 0.0 - 1.2 mg/dL 0.3  <9.7    Alkaline Phos 44 - 121 IU/L 121  121    AST 0 - 40 IU/L 27  20    ALT 0 - 32 IU/L 24  18     Lipid Panel     Component Value Date/Time   CHOL 157 08/11/2023  1453   TRIG 65 08/11/2023 1453   HDL 64 08/11/2023 1453   CHOLHDL 2.5 08/11/2023 1453   LDLCALC 80 08/11/2023 1453    CBC    Component Value Date/Time   WBC 7.7 08/11/2023 1453   WBC 9.8 09/17/2018 0337   RBC 5.00 08/11/2023 1453   RBC 4.74 09/17/2018 0337   HGB 14.8 08/11/2023 1453   HCT 46.1 08/11/2023 1453   PLT 220 08/11/2023 1453   MCV 92 08/11/2023 1453   MCH 29.6 08/11/2023 1453   MCH 28.9 09/17/2018 0337   MCHC 32.1 08/11/2023 1453   MCHC 33.8 09/17/2018 0337   RDW 12.4 08/11/2023 1453   LYMPHSABS 3.5 (H) 10/11/2018 1127   MONOABS 0.5 09/17/2018 0337   EOSABS 0.1 10/11/2018 1127   BASOSABS 0.1 10/11/2018 1127    ASSESSMENT AND PLAN: 1. Persistent cough (Primary) Likely postviral cough syndrome versus postnasal drip versus acid reflux.  I think postviral cough syndrome most likely.  Advised patient that it can take several weeks for cough to resolve after a viral respiratory illness.  I think she also has a component of postnasal drip.  Advised to continue the Flonase  nasal spray.  We will add Claritin .  Will also give a 30-day course of omeprazole .  Can purchase diabetic [place] cough syrup over-the-counter and use as needed.  Her daughter had sent me a MyChart message about getting a chest x-ray.  I do not think that it is totally necessary but for peace of mind we will get it. - loratadine  (CLARITIN ) 10 MG tablet; Take 1 tablet (10 mg total) by mouth daily. To pick up today  Dispense: 30 tablet; Refill: 0 - omeprazole  (PRILOSEC) 20 MG capsule; Take 1 capsule (20 mg total) by mouth daily.  Dispense: 30 capsule; Refill: 0 - DG Chest 2 View; Future  2. Hypertension associated with type  2 diabetes mellitus (HCC) Repeat blood pressure today at goal.  Patient to continue current medications. - spironolactone  (ALDACTONE ) 25 MG tablet; Take 1 tablet (25 mg total) by mouth daily.  Dispense: 90 tablet; Refill: 1 - carvedilol  (COREG ) 6.25 MG tablet; Take 1 tablet (6.25 mg total)  by mouth 2 (two) times daily with a meal.  Dispense: 180 tablet; Refill: 0 - losartan  (COZAAR ) 100 MG tablet; Take 1 tablet (100 mg total) by mouth daily.Must have office visit for refills  Dispense: 90 tablet; Refill: 2 - diltiazem  (CARDIZEM  CD) 240 MG 24 hr capsule; Take 1 capsule (240 mg total) by mouth daily.  Dispense: 90 capsule; Refill: 0   Patient was given the opportunity to ask questions.  Patient verbalized understanding of the plan and was able to repeat key elements of the plan.   This documentation was completed using Paediatric nurse.  Any transcriptional errors are unintentional.  Orders Placed This Encounter  Procedures   DG Chest 2 View     Requested Prescriptions   Signed Prescriptions Disp Refills   spironolactone  (ALDACTONE ) 25 MG tablet 90 tablet 1    Sig: Take 1 tablet (25 mg total) by mouth daily.   carvedilol  (COREG ) 6.25 MG tablet 180 tablet 0    Sig: Take 1 tablet (6.25 mg total) by mouth 2 (two) times daily with a meal.   losartan  (COZAAR ) 100 MG tablet 90 tablet 2    Sig: Take 1 tablet (100 mg total) by mouth daily.Must have office visit for refills   diltiazem  (CARDIZEM  CD) 240 MG 24 hr capsule 90 capsule 0    Sig: Take 1 capsule (240 mg total) by mouth daily.   loratadine  (CLARITIN ) 10 MG tablet 30 tablet 0    Sig: Take 1 tablet (10 mg total) by mouth daily. To pick up today   omeprazole  (PRILOSEC) 20 MG capsule 30 capsule 0    Sig: Take 1 capsule (20 mg total) by mouth daily.    Return in about 1 month (around 03/10/2024) for PAP.  Barnie Louder, MD, FACP     [1]  Current Outpatient Medications on File Prior to Visit  Medication Sig Dispense Refill   aspirin  EC 81 MG tablet Take 1 tablet (81 mg total) by mouth daily. Swallow whole. 90 each 3   atorvastatin  (LIPITOR) 20 MG tablet Take 1 tablet (20 mg total) by mouth daily. 90 tablet 1   Blood Glucose Monitoring Suppl (ONETOUCH VERIO REFLECT) w/Device KIT 1 kit by Does not  apply route 2 (two) times daily. 1 kit 0   fluticasone  (FLONASE ) 50 MCG/ACT nasal spray Place 1 spray into both nostrils daily. 16 g 0   glucose blood (ONETOUCH VERIO) test strip Use as instructed 100 each 12   metFORMIN  (GLUCOPHAGE ) 500 MG tablet Take 1 tablet (500 mg total) by mouth daily with breakfast. 90 tablet 0   OneTouch Delica Lancets 33G MISC Use to check blood sugar twice daily. 100 each 11   No current facility-administered medications on file prior to visit.  [2] No Known Allergies  "

## 2024-02-13 ENCOUNTER — Ambulatory Visit: Payer: Self-pay | Admitting: Internal Medicine

## 2024-03-15 ENCOUNTER — Ambulatory Visit: Payer: Self-pay | Admitting: Internal Medicine
